# Patient Record
Sex: Male | Born: 1935 | Race: White | Hispanic: No | Smoking: Former smoker
Health system: Southern US, Community
[De-identification: ages and names within clinical notes are randomized; demographics above are authoritative.]

## PROBLEM LIST (undated history)

## (undated) DIAGNOSIS — J45909 Unspecified asthma, uncomplicated: Secondary | ICD-10-CM

## (undated) DIAGNOSIS — Z923 Personal history of irradiation: Secondary | ICD-10-CM

## (undated) DIAGNOSIS — I10 Essential (primary) hypertension: Secondary | ICD-10-CM

## (undated) DIAGNOSIS — IMO0001 Reserved for inherently not codable concepts without codable children: Secondary | ICD-10-CM

## (undated) DIAGNOSIS — R296 Repeated falls: Secondary | ICD-10-CM

## (undated) DIAGNOSIS — M1990 Unspecified osteoarthritis, unspecified site: Secondary | ICD-10-CM

## (undated) DIAGNOSIS — J189 Pneumonia, unspecified organism: Secondary | ICD-10-CM

## (undated) DIAGNOSIS — N210 Calculus in bladder: Secondary | ICD-10-CM

## (undated) DIAGNOSIS — W19XXXA Unspecified fall, initial encounter: Secondary | ICD-10-CM

## (undated) DIAGNOSIS — T8859XA Other complications of anesthesia, initial encounter: Secondary | ICD-10-CM

## (undated) DIAGNOSIS — R011 Cardiac murmur, unspecified: Secondary | ICD-10-CM

## (undated) DIAGNOSIS — R202 Paresthesia of skin: Secondary | ICD-10-CM

## (undated) DIAGNOSIS — Z87442 Personal history of urinary calculi: Secondary | ICD-10-CM

## (undated) DIAGNOSIS — K219 Gastro-esophageal reflux disease without esophagitis: Secondary | ICD-10-CM

## (undated) DIAGNOSIS — C801 Malignant (primary) neoplasm, unspecified: Secondary | ICD-10-CM

## (undated) DIAGNOSIS — I2510 Atherosclerotic heart disease of native coronary artery without angina pectoris: Secondary | ICD-10-CM

## (undated) DIAGNOSIS — Z978 Presence of other specified devices: Secondary | ICD-10-CM

## (undated) DIAGNOSIS — Z8619 Personal history of other infectious and parasitic diseases: Secondary | ICD-10-CM

## (undated) DIAGNOSIS — N400 Enlarged prostate without lower urinary tract symptoms: Secondary | ICD-10-CM

## (undated) DIAGNOSIS — T4145XA Adverse effect of unspecified anesthetic, initial encounter: Secondary | ICD-10-CM

## (undated) DIAGNOSIS — N390 Urinary tract infection, site not specified: Secondary | ICD-10-CM

## (undated) DIAGNOSIS — Z960 Presence of urogenital implants: Secondary | ICD-10-CM

## (undated) HISTORY — PX: EYE SURGERY: SHX253

## (undated) HISTORY — PX: OTHER SURGICAL HISTORY: SHX169

---

## 2015-02-20 DIAGNOSIS — I2510 Atherosclerotic heart disease of native coronary artery without angina pectoris: Secondary | ICD-10-CM | POA: Insufficient documentation

## 2015-02-20 DIAGNOSIS — Z01810 Encounter for preprocedural cardiovascular examination: Secondary | ICD-10-CM | POA: Insufficient documentation

## 2015-02-20 DIAGNOSIS — I350 Nonrheumatic aortic (valve) stenosis: Secondary | ICD-10-CM | POA: Insufficient documentation

## 2015-02-20 DIAGNOSIS — I10 Essential (primary) hypertension: Secondary | ICD-10-CM | POA: Insufficient documentation

## 2015-02-22 ENCOUNTER — Other Ambulatory Visit: Payer: Self-pay | Admitting: Urology

## 2015-02-28 ENCOUNTER — Encounter (HOSPITAL_COMMUNITY): Payer: Self-pay

## 2015-02-28 ENCOUNTER — Encounter (HOSPITAL_COMMUNITY)
Admission: RE | Admit: 2015-02-28 | Discharge: 2015-02-28 | Disposition: A | Discharge status: Home / Self Care | Payer: Medicare HMO | Source: Ambulatory Visit | Attending: Urology | Admitting: Urology

## 2015-02-28 DIAGNOSIS — N401 Enlarged prostate with lower urinary tract symptoms: Secondary | ICD-10-CM | POA: Diagnosis present

## 2015-02-28 DIAGNOSIS — N210 Calculus in bladder: Secondary | ICD-10-CM | POA: Diagnosis not present

## 2015-02-28 DIAGNOSIS — M1990 Unspecified osteoarthritis, unspecified site: Secondary | ICD-10-CM | POA: Diagnosis not present

## 2015-02-28 DIAGNOSIS — Z79899 Other long term (current) drug therapy: Secondary | ICD-10-CM | POA: Diagnosis not present

## 2015-02-28 DIAGNOSIS — I10 Essential (primary) hypertension: Secondary | ICD-10-CM | POA: Diagnosis not present

## 2015-02-28 DIAGNOSIS — Z85820 Personal history of malignant melanoma of skin: Secondary | ICD-10-CM | POA: Diagnosis not present

## 2015-02-28 DIAGNOSIS — R338 Other retention of urine: Secondary | ICD-10-CM | POA: Diagnosis not present

## 2015-02-28 DIAGNOSIS — Z87891 Personal history of nicotine dependence: Secondary | ICD-10-CM | POA: Diagnosis not present

## 2015-02-28 DIAGNOSIS — Z96652 Presence of left artificial knee joint: Secondary | ICD-10-CM | POA: Diagnosis not present

## 2015-02-28 DIAGNOSIS — Z9841 Cataract extraction status, right eye: Secondary | ICD-10-CM | POA: Diagnosis not present

## 2015-02-28 DIAGNOSIS — K219 Gastro-esophageal reflux disease without esophagitis: Secondary | ICD-10-CM | POA: Diagnosis not present

## 2015-02-28 DIAGNOSIS — Z9842 Cataract extraction status, left eye: Secondary | ICD-10-CM | POA: Diagnosis not present

## 2015-02-28 DIAGNOSIS — Z7982 Long term (current) use of aspirin: Secondary | ICD-10-CM | POA: Diagnosis not present

## 2015-02-28 DIAGNOSIS — J45909 Unspecified asthma, uncomplicated: Secondary | ICD-10-CM | POA: Diagnosis not present

## 2015-02-28 HISTORY — DX: Presence of other specified devices: Z97.8

## 2015-02-28 HISTORY — DX: Unspecified fall, initial encounter: W19.XXXA

## 2015-02-28 HISTORY — DX: Other complications of anesthesia, initial encounter: T88.59XA

## 2015-02-28 HISTORY — DX: Unspecified osteoarthritis, unspecified site: M19.90

## 2015-02-28 HISTORY — DX: Calculus in bladder: N21.0

## 2015-02-28 HISTORY — DX: Benign prostatic hyperplasia without lower urinary tract symptoms: N40.0

## 2015-02-28 HISTORY — DX: Adverse effect of unspecified anesthetic, initial encounter: T41.45XA

## 2015-02-28 HISTORY — DX: Repeated falls: R29.6

## 2015-02-28 HISTORY — DX: Pneumonia, unspecified organism: J18.9

## 2015-02-28 HISTORY — DX: Personal history of other infectious and parasitic diseases: Z86.19

## 2015-02-28 HISTORY — DX: Personal history of irradiation: Z92.3

## 2015-02-28 HISTORY — DX: Gastro-esophageal reflux disease without esophagitis: K21.9

## 2015-02-28 HISTORY — DX: Presence of urogenital implants: Z96.0

## 2015-02-28 HISTORY — DX: Malignant (primary) neoplasm, unspecified: C80.1

## 2015-02-28 HISTORY — DX: Unspecified asthma, uncomplicated: J45.909

## 2015-02-28 HISTORY — DX: Essential (primary) hypertension: I10

## 2015-02-28 HISTORY — DX: Reserved for inherently not codable concepts without codable children: IMO0001

## 2015-02-28 HISTORY — DX: Urinary tract infection, site not specified: N39.0

## 2015-02-28 HISTORY — DX: Paresthesia of skin: R20.2

## 2015-02-28 LAB — BASIC METABOLIC PANEL
Anion gap: 6 (ref 5–15)
BUN: 22 mg/dL — AB (ref 6–20)
CHLORIDE: 103 mmol/L (ref 101–111)
CO2: 29 mmol/L (ref 22–32)
Calcium: 9.5 mg/dL (ref 8.9–10.3)
Creatinine, Ser: 1.33 mg/dL — ABNORMAL HIGH (ref 0.61–1.24)
GFR calc Af Amer: 57 mL/min — ABNORMAL LOW (ref >60)
GFR calc non Af Amer: 49 mL/min — ABNORMAL LOW (ref >60)
GLUCOSE: 102 mg/dL — AB (ref 65–99)
POTASSIUM: 5.6 mmol/L — AB (ref 3.5–5.1)
Sodium: 138 mmol/L (ref 135–145)

## 2015-02-28 LAB — CBC
HCT: 41.5 % (ref 39.0–52.0)
HEMOGLOBIN: 12.7 g/dL — AB (ref 13.0–17.0)
MCH: 27.5 pg (ref 26.0–34.0)
MCHC: 30.6 g/dL (ref 30.0–36.0)
MCV: 90 fL (ref 78.0–100.0)
Platelets: 272 10*3/uL (ref 150–400)
RBC: 4.61 MIL/uL (ref 4.22–5.81)
RDW: 15.4 % (ref 11.5–15.5)
WBC: 8.6 10*3/uL (ref 4.0–10.5)

## 2015-02-28 NOTE — Progress Notes (Signed)
EKG per chart 02/21/2015  H&P per chart per Dr Munley/cardiology 02/21/2015 clearance noted

## 2015-02-28 NOTE — Progress Notes (Signed)
BMP results in epic per PAT visit 02/28/2015 sent to Dr Alyson Ingles

## 2015-02-28 NOTE — Progress Notes (Addendum)
Contacted pts nephew Sevyn Paredez in regards to pt is able to use combivent inhaler if needed day of surgery. Nephew verbalized understanding and stated would inform pt. Also informed to bring day of surgery.

## 2015-02-28 NOTE — Patient Instructions (Signed)
Charles George  02/28/2015   Your procedure is scheduled on: Wednesday March 01, 2015  Report to [facility]  Entrance take [place]  elevators to 3rd floor to  [place] at 7:15 AM.  Call this number if you have problems the morning of surgery [phone]   Remember: ONLY 1 PERSON MAY GO WITH YOU TO SHORT STAY TO GET  READY MORNING OF [place].  Do not eat food or drink liquids :After Midnight.     Take these medicines the morning of surgery with A SIP OF WATER: NONE                               You may not have any metal on your body including hair pins and              piercings  Do not wear jewelry,  lotions, powders colognes, deodorant             Men may shave face and neck.   Do not bring valuables to the hospital. [place].  Contacts, dentures or bridgework may not be worn into surgery.      Patients discharged the day of surgery will not be allowed to drive home.  Name and phone number of your driver:Charles George (nephew) [phone]  _____________________________________________________________________             [facility] - Preparing for Surgery Before surgery, you can play an important role.  Because skin is not sterile, your skin needs to be as free of germs as possible.  You can reduce the number of germs on your skin by washing with CHG (chlorahexidine gluconate) soap before surgery.  CHG is an antiseptic cleaner which kills germs and bonds with the skin to continue killing germs even after washing. Please DO NOT use if you have an allergy to CHG or antibacterial soaps.  If your skin becomes reddened/irritated stop using the CHG and inform your nurse when you arrive at Short Stay. Do not shave (including legs and underarms) for at least 48 hours prior to the first CHG shower.  You may shave your face/neck. Please follow these instructions carefully:  1.  Shower with CHG Soap  the night before surgery and the  morning of Surgery.  2.  If you choose to wash your hair, wash your hair first as usual with your  normal  shampoo.  3.  After you shampoo, rinse your hair and body thoroughly to remove the  shampoo.                           4.  Use CHG as you would any other liquid soap.  You can apply chg directly  to the skin and wash                       Gently with a scrungie or clean washcloth.  5.  Apply the CHG Soap to your body ONLY FROM THE NECK DOWN.   Do not use on face/ open  Wound or open sores. Avoid contact with eyes, ears mouth and genitals (private parts).                       Wash face,  Genitals (private parts) with your normal soap.             6.  Wash thoroughly, paying special attention to the area where your surgery  will be performed.  7.  Thoroughly rinse your body with warm water from the neck down.  8.  DO NOT shower/wash with your normal soap after using and rinsing off  the CHG Soap.                9.  Pat yourself dry with a clean towel.            10.  Wear clean pajamas.            11.  Place clean sheets on your bed the night of your first shower and do not  sleep with pets. Day of Surgery : Do not apply any lotions/deodorants the morning of surgery.  Please wear clean clothes to the hospital/surgery center.  FAILURE TO FOLLOW THESE INSTRUCTIONS MAY RESULT IN THE CANCELLATION OF YOUR SURGERY PATIENT SIGNATURE_________________________________  NURSE SIGNATURE__________________________________  ________________________________________________________________________

## 2015-03-01 ENCOUNTER — Encounter (HOSPITAL_COMMUNITY)
Admission: RE | Disposition: A | Discharge status: Home / Self Care | Payer: Self-pay | Source: Ambulatory Visit | Attending: Urology

## 2015-03-01 ENCOUNTER — Ambulatory Visit (HOSPITAL_COMMUNITY)
Admission: RE | Admit: 2015-03-01 | Discharge: 2015-03-02 | Disposition: A | Discharge status: Home / Self Care | Payer: Medicare HMO | Source: Ambulatory Visit | Attending: Urology | Admitting: Urology

## 2015-03-01 ENCOUNTER — Ambulatory Visit (HOSPITAL_COMMUNITY): Payer: Medicare HMO | Admitting: Anesthesiology

## 2015-03-01 ENCOUNTER — Encounter (HOSPITAL_COMMUNITY): Payer: Self-pay | Admitting: *Deleted

## 2015-03-01 DIAGNOSIS — N210 Calculus in bladder: Secondary | ICD-10-CM | POA: Insufficient documentation

## 2015-03-01 DIAGNOSIS — Z7982 Long term (current) use of aspirin: Secondary | ICD-10-CM | POA: Insufficient documentation

## 2015-03-01 DIAGNOSIS — N401 Enlarged prostate with lower urinary tract symptoms: Secondary | ICD-10-CM | POA: Insufficient documentation

## 2015-03-01 DIAGNOSIS — M1990 Unspecified osteoarthritis, unspecified site: Secondary | ICD-10-CM | POA: Insufficient documentation

## 2015-03-01 DIAGNOSIS — Z9841 Cataract extraction status, right eye: Secondary | ICD-10-CM | POA: Insufficient documentation

## 2015-03-01 DIAGNOSIS — I10 Essential (primary) hypertension: Secondary | ICD-10-CM | POA: Diagnosis not present

## 2015-03-01 DIAGNOSIS — Z9842 Cataract extraction status, left eye: Secondary | ICD-10-CM | POA: Insufficient documentation

## 2015-03-01 DIAGNOSIS — R338 Other retention of urine: Secondary | ICD-10-CM | POA: Insufficient documentation

## 2015-03-01 DIAGNOSIS — K219 Gastro-esophageal reflux disease without esophagitis: Secondary | ICD-10-CM | POA: Insufficient documentation

## 2015-03-01 DIAGNOSIS — Z79899 Other long term (current) drug therapy: Secondary | ICD-10-CM | POA: Insufficient documentation

## 2015-03-01 DIAGNOSIS — Z87891 Personal history of nicotine dependence: Secondary | ICD-10-CM | POA: Insufficient documentation

## 2015-03-01 DIAGNOSIS — Z96652 Presence of left artificial knee joint: Secondary | ICD-10-CM | POA: Insufficient documentation

## 2015-03-01 DIAGNOSIS — Z85820 Personal history of malignant melanoma of skin: Secondary | ICD-10-CM | POA: Insufficient documentation

## 2015-03-01 DIAGNOSIS — J45909 Unspecified asthma, uncomplicated: Secondary | ICD-10-CM | POA: Insufficient documentation

## 2015-03-01 DIAGNOSIS — N400 Enlarged prostate without lower urinary tract symptoms: Secondary | ICD-10-CM | POA: Diagnosis present

## 2015-03-01 HISTORY — PX: TRANSURETHRAL RESECTION OF PROSTATE: SHX73

## 2015-03-01 HISTORY — PX: CYSTOSCOPY WITH LITHOLAPAXY: SHX1425

## 2015-03-01 SURGERY — TRANSURETHRAL RESECTION OF THE PROSTATE WITH GYRUS INSTRUMENTS
Anesthesia: Spinal | Site: Urethra

## 2015-03-01 MED ORDER — LACTATED RINGERS IV SOLN
INTRAVENOUS | Status: DC | PRN
  Administered 2015-03-01: 09:00:00 via INTRAVENOUS

## 2015-03-01 MED ORDER — DEXAMETHASONE SODIUM PHOSPHATE 10 MG/ML IJ SOLN
INTRAMUSCULAR | Status: AC
  Filled 2015-03-01: qty 1

## 2015-03-01 MED ORDER — SODIUM CHLORIDE 0.9 % IR SOLN
Status: DC | PRN
  Administered 2015-03-01: 27000 mL

## 2015-03-01 MED ORDER — PIPERACILLIN-TAZOBACTAM 3.375 G IVPB
3.3750 g | Freq: Three times a day (TID) | INTRAVENOUS | Status: AC
  Administered 2015-03-01 (×2): 3.375 g via INTRAVENOUS
  Filled 2015-03-01 (×2): qty 50

## 2015-03-01 MED ORDER — ONDANSETRON HCL 4 MG/2ML IJ SOLN
INTRAMUSCULAR | Status: AC
  Filled 2015-03-01: qty 2

## 2015-03-01 MED ORDER — FENTANYL CITRATE (PF) 100 MCG/2ML IJ SOLN: 25.0000 ug | INTRAMUSCULAR | Status: DC | PRN

## 2015-03-01 MED ORDER — DEXAMETHASONE SODIUM PHOSPHATE 10 MG/ML IJ SOLN
INTRAMUSCULAR | Status: DC | PRN
  Administered 2015-03-01: 10 mg via INTRAVENOUS

## 2015-03-01 MED ORDER — PIPERACILLIN-TAZOBACTAM 3.375 G IVPB 30 MIN
3.3750 g | INTRAVENOUS | Status: AC
  Administered 2015-03-01: 3.375 g via INTRAVENOUS
  Filled 2015-03-01: qty 50

## 2015-03-01 MED ORDER — PROMETHAZINE HCL 25 MG/ML IJ SOLN
6.2500 mg | INTRAMUSCULAR | Status: DC | PRN
Start: 2015-03-01 — End: 2015-03-01

## 2015-03-01 MED ORDER — PIPERACILLIN-TAZOBACTAM 3.375 G IVPB
INTRAVENOUS | Status: AC
  Filled 2015-03-01: qty 50

## 2015-03-01 MED ORDER — BELLADONNA ALKALOIDS-OPIUM 16.2-60 MG RE SUPP: 1.0000 | Freq: Four times a day (QID) | RECTAL | Status: DC | PRN

## 2015-03-01 MED ORDER — SODIUM CHLORIDE 0.9 % IV SOLN
INTRAVENOUS | Status: DC
  Administered 2015-03-01 – 2015-03-02 (×2): via INTRAVENOUS

## 2015-03-01 MED ORDER — ASPIRIN EC 81 MG PO TBEC
81.0000 mg | DELAYED_RELEASE_TABLET | Freq: Every day | ORAL | Status: DC
  Administered 2015-03-01 – 2015-03-02 (×2): 81 mg via ORAL
  Filled 2015-03-01 (×2): qty 1

## 2015-03-01 MED ORDER — DIPHENHYDRAMINE HCL 50 MG/ML IJ SOLN: 12.5000 mg | Freq: Four times a day (QID) | INTRAMUSCULAR | Status: DC | PRN

## 2015-03-01 MED ORDER — DOCUSATE SODIUM 100 MG PO CAPS
100.0000 mg | ORAL_CAPSULE | Freq: Two times a day (BID) | ORAL | Status: DC
  Administered 2015-03-01: 100 mg via ORAL
  Filled 2015-03-01 (×2): qty 1

## 2015-03-01 MED ORDER — 0.9 % SODIUM CHLORIDE (POUR BTL) OPTIME
TOPICAL | Status: DC | PRN
  Administered 2015-03-01: 1000 mL

## 2015-03-01 MED ORDER — HYDROMORPHONE HCL 1 MG/ML IJ SOLN: 0.5000 mg | INTRAMUSCULAR | Status: DC | PRN

## 2015-03-01 MED ORDER — IPRATROPIUM-ALBUTEROL 0.5-2.5 (3) MG/3ML IN SOLN: 3.0000 mL | Freq: Four times a day (QID) | RESPIRATORY_TRACT | Status: DC | PRN

## 2015-03-01 MED ORDER — OXYCODONE HCL 5 MG PO TABS
5.0000 mg | ORAL_TABLET | ORAL | Status: DC | PRN
  Administered 2015-03-01: 5 mg via ORAL
  Filled 2015-03-01: qty 1

## 2015-03-01 MED ORDER — STERILE WATER FOR IRRIGATION IR SOLN
Status: DC | PRN
  Administered 2015-03-01: 10 mL via INTRAVESICAL

## 2015-03-01 MED ORDER — INFLUENZA VAC SPLIT QUAD 0.5 ML IM SUSY
0.5000 mL | PREFILLED_SYRINGE | INTRAMUSCULAR | Status: AC
  Administered 2015-03-02: 0.5 mL via INTRAMUSCULAR
  Filled 2015-03-01 (×2): qty 0.5

## 2015-03-01 MED ORDER — DIPHENHYDRAMINE HCL 12.5 MG/5ML PO ELIX: 12.5000 mg | ORAL_SOLUTION | Freq: Four times a day (QID) | ORAL | Status: DC | PRN

## 2015-03-01 MED ORDER — ONDANSETRON HCL 4 MG/2ML IJ SOLN: 4.0000 mg | INTRAMUSCULAR | Status: DC | PRN

## 2015-03-01 MED ORDER — BUPIVACAINE IN DEXTROSE 0.75-8.25 % IT SOLN
INTRATHECAL | Status: DC | PRN
  Administered 2015-03-01: 1.6 mL via INTRATHECAL

## 2015-03-01 MED ORDER — ONDANSETRON HCL 4 MG/2ML IJ SOLN
INTRAMUSCULAR | Status: DC | PRN
  Administered 2015-03-01: 4 mg via INTRAVENOUS

## 2015-03-01 MED ORDER — IPRATROPIUM-ALBUTEROL 20-100 MCG/ACT IN AERS: 1.0000 | INHALATION_SPRAY | Freq: Four times a day (QID) | RESPIRATORY_TRACT | Status: DC | PRN

## 2015-03-01 MED ORDER — PROPOFOL 10 MG/ML IV BOLUS
INTRAVENOUS | Status: AC
  Filled 2015-03-01: qty 20

## 2015-03-01 MED ORDER — PROPOFOL 10 MG/ML IV BOLUS
INTRAVENOUS | Status: DC | PRN
  Administered 2015-03-01: 30 mg via INTRAVENOUS

## 2015-03-01 MED ORDER — LACTATED RINGERS IV SOLN: INTRAVENOUS | Status: DC

## 2015-03-01 MED ORDER — FENTANYL CITRATE (PF) 100 MCG/2ML IJ SOLN
INTRAMUSCULAR | Status: AC
  Filled 2015-03-01: qty 4

## 2015-03-01 MED ORDER — ZOLPIDEM TARTRATE 5 MG PO TABS
5.0000 mg | ORAL_TABLET | Freq: Every evening | ORAL | Status: DC | PRN
  Administered 2015-03-01: 5 mg via ORAL
  Filled 2015-03-01: qty 1

## 2015-03-01 MED ORDER — DONEPEZIL HCL 5 MG PO TABS
5.0000 mg | ORAL_TABLET | Freq: Every day | ORAL | Status: DC
  Administered 2015-03-01: 5 mg via ORAL
  Filled 2015-03-01: qty 1

## 2015-03-01 SURGICAL SUPPLY — 17 items
BAG URINE DRAINAGE (UROLOGICAL SUPPLIES) ×3 IMPLANT
BAG URO CATCHER STRL LF (DRAPE) ×3 IMPLANT
CARTRIDGE STONEBREAK CO2 KIDNE (ELECTROSURGICAL) ×3 IMPLANT
CATH FOLEY 3WAY 30CC 22FR (CATHETERS) ×3 IMPLANT
GLOVE BIO SURGEON STRL SZ8 (GLOVE) ×3 IMPLANT
GOWN STRL REUS W/TWL LRG LVL3 (GOWN DISPOSABLE) ×6 IMPLANT
HOLDER FOLEY CATH W/STRAP (MISCELLANEOUS) ×3 IMPLANT
IV NS IRRIG 3000ML ARTHROMATIC (IV SOLUTION) ×6 IMPLANT
LOOP CUT BIPOLAR 24F LRG (ELECTROSURGICAL) ×3 IMPLANT
MANIFOLD NEPTUNE II (INSTRUMENTS) ×3 IMPLANT
PACK CYSTO (CUSTOM PROCEDURE TRAY) ×3 IMPLANT
PLUG CATH AND CAP STER (CATHETERS) ×3 IMPLANT
PROBE PNEUMATIC 1.6MM (ELECTROSURGICAL) ×3 IMPLANT
SYR 30ML LL (SYRINGE) ×3 IMPLANT
SYRINGE IRR TOOMEY STRL 70CC (SYRINGE) ×3 IMPLANT
TUBING CONNECTING 10 (TUBING) ×2 IMPLANT
TUBING CONNECTING 10' (TUBING) ×1

## 2015-03-01 NOTE — H&P (Signed)
Urology Admission H&P  Chief Complaint: suprapubic pain  History of Present Illness: Charles George is 81yo with a hx of BPH and retention who presented to my office with an indwelling foley. On office cystoscopy he was found to have a large bladder calculus. He had severe LUTS prior to foley placement. He denies fevers/chill/sweats  Past Medical History  Diagnosis Date  . GERD (gastroesophageal reflux disease)   . Hypertension   . Shortness of breath dyspnea     increased exertion   . Asthma   . Pneumonia     09/2014   . Arthritis   . Tingling     hands bilat   . Falls   . Foley catheter in place   . Bladder stone   . BPH (benign prostatic hypertrophy)   . Urinary tract infection     hx of   . Cancer     melanoma on right arm   . History of radiation therapy   . History of chicken pox   . Complication of anesthesia     10/19/2014 following knee surgery pt states did not awaken for a day and half   Past Surgical History  Procedure Laterality Date  . Left knee replacement       09/2014   . Eye surgery      cataracts removed bilat     Home Medications:  Prescriptions prior to admission  Medication Sig Dispense Refill Last Dose  . acetaminophen (TYLENOL) 325 MG tablet Take 325 mg by mouth at bedtime as needed for moderate pain.     Marland Kitchen aspirin 81 MG tablet Take 81 mg by mouth daily.   02/22/2015  . donepezil (ARICEPT) 5 MG tablet Take 5 mg by mouth at bedtime.   02/28/2015 at pm  . Ipratropium-Albuterol (COMBIVENT RESPIMAT) 20-100 MCG/ACT AERS respimat Inhale 1 puff into the lungs every 6 (six) hours as needed for wheezing or shortness of breath.   03/01/2015 at 0600  . lactobacillus acidophilus (BACID) TABS tablet Take 1 tablet by mouth daily.   02/28/2015 at pm  . tamsulosin (FLOMAX) 0.4 MG CAPS capsule Take 0.4 mg by mouth 2 (two) times daily.   02/28/2015 at pm  . Vitamin D, Ergocalciferol, (DRISDOL) 50000 UNITS CAPS capsule Take 50,000 Units by mouth every 7 (seven) days. Wednesday    02/22/2015   Allergies:  Allergies  Allergen Reactions  . Ciprofloxacin     Stomach pain    History reviewed. No pertinent family history. Social History:  reports that he quit smoking about 30 years ago. His smoking use included Cigarettes. He has a 45 pack-year smoking history. His smokeless tobacco use includes Chew. He reports that he drinks alcohol. He reports that he does not use illicit drugs.  Review of Systems  Gastrointestinal: Positive for nausea and vomiting.  Genitourinary: Positive for dysuria, urgency and frequency.  All other systems reviewed and are negative.   Physical Exam:  Vital signs in last 24 hours: Temp:  [97.6 F (36.4 C)-98 F (36.7 C)] 97.6 F (36.4 C) (09/07 0703) Pulse Rate:  [70-83] 83 (09/07 0703) Resp:  [16-18] 18 (09/07 0703) BP: (136)/(59) 136/59 mmHg (09/06 1016) SpO2:  [99 %-100 %] 99 % (09/07 0703) Weight:  [65.318 kg (144 lb)] 65.318 kg (144 lb) (09/07 0751) Physical Exam  Constitutional: He is oriented to person, place, and time. He appears well-developed and well-nourished.  HENT:  Head: Normocephalic and atraumatic.  Eyes: EOM are normal. Pupils are equal, round, and  reactive to light.  Neck: Normal range of motion. No thyromegaly present.  Cardiovascular: Normal rate and regular rhythm.   Respiratory: Effort normal. No respiratory distress.  GI: Soft. He exhibits no distension.  Musculoskeletal: Normal range of motion.  Neurological: He is alert and oriented to person, place, and time.  Skin: Skin is warm and dry.  Psychiatric: He has a normal mood and affect. His behavior is normal. Judgment and thought content normal.    Laboratory Data:  Results for orders placed or performed during the hospital encounter of 02/28/15 (from the past 24 hour(s))  CBC     Status: Abnormal   Collection Time: 02/28/15 11:15 AM  Result Value Ref Range   WBC 8.6 4.0 - 10.5 K/uL   RBC 4.61 4.22 - 5.81 MIL/uL   Hemoglobin 12.7 (L) 13.0 - 17.0 g/dL    HCT 41.5 39.0 - 52.0 %   MCV 90.0 78.0 - 100.0 fL   MCH 27.5 26.0 - 34.0 pg   MCHC 30.6 30.0 - 36.0 g/dL   RDW 15.4 11.5 - 15.5 %   Platelets 272 150 - 400 K/uL  Basic metabolic panel     Status: Abnormal   Collection Time: 02/28/15 11:15 AM  Result Value Ref Range   Sodium 138 135 - 145 mmol/L   Potassium 5.6 (H) 3.5 - 5.1 mmol/L   Chloride 103 101 - 111 mmol/L   CO2 29 22 - 32 mmol/L   Glucose, Bld 102 (H) 65 - 99 mg/dL   BUN 22 (H) 6 - 20 mg/dL   Creatinine, Ser 1.33 (H) 0.61 - 1.24 mg/dL   Calcium 9.5 8.9 - 10.3 mg/dL   GFR calc non Af Amer 49 (L) >60 mL/min   GFR calc Af Amer 57 (L) >60 mL/min   Anion gap 6 5 - 15   No results found for this or any previous visit (from the past 240 hour(s)). Creatinine:  Recent Labs  02/28/15 1115  CREATININE 1.33*   Baseline Creatinine: unknown  Impression/Assessment:  81yo with urinary retention, BPH, and bladder calculi  Plan:  1. The risks/benefits/alternatives to TURP and cystolithalopaxy was explained to the patient and the patient understands and wishes to proceed with surgery. 2. Pt to be admitted for outpatient with extended recovery following TURP  MCKENZIE, PATRICK L 03/01/2015, 9:15 AM

## 2015-03-01 NOTE — OR Nursing (Signed)
Bladder stone taken by Dr. Alyson Ingles

## 2015-03-01 NOTE — Anesthesia Procedure Notes (Signed)
Spinal Patient location during procedure: OR Staffing Anesthesiologist: Suzette Battiest Performed by: anesthesiologist  Preanesthetic Checklist Completed: patient identified, site marked, surgical consent, pre-op evaluation, timeout performed, IV checked, risks and benefits discussed and monitors and equipment checked Spinal Block Patient position: sitting Prep: ChloraPrep and site prepped and draped Patient monitoring: heart rate, continuous pulse ox and blood pressure Approach: right paramedian Location: L4-5 Injection technique: single-shot Needle Needle type: Spinocan  Needle gauge: 22 G Needle length: 9 cm Additional Notes Expiration date of kit checked and confirmed. Patient tolerated procedure well, without complications. CSF return before and after injection LA.

## 2015-03-01 NOTE — Op Note (Signed)
Preoperative diagnosis: BPH, bladder calculi  Postoperative diagnosis: BPH, bladder calculi  Procedure: 1 cystoscopy 2. Transurethral resection of the prostate 3. Cystolithalopaxy  Attending: Nicolette Bang  Anesthesia: General  Estimated blood loss: Minimal  Drains: 22 French foley  Specimens: 1. Prostate Chips 2. Bladder stone  Antibiotics: Zosyn  Findings: Trilobar prostate enlargement. 3cm bladder stone with multiple smaller stones.  Ureteral orifices in normal anatomic location.   Indications: Patient is a 81 year old male with a history of BPH, retention, and a large bladder stone.  After discussing treatment options, they decided proceed with transurethral resection of the prostate and cystolithalopaxy.  Procedure her in detail: The patient was brought to the operating room and a brief timeout was done to ensure correct patient, correct procedure, correct site.  General anesthesia was administered patient was placed in dorsal lithotomy position.  Their genitalia was then prepped and draped in usual sterile fashion.  A rigid 59 French cystoscope was passed in the urethra and the bladder.  Bladder was inspected and we noted no masses or lesions.  We noted a large jack stone and multiple smaller stones. the ureteral orifices were in the normal orthotopic locations. We placed a nephroscope into the bladder. Using the pneumatic stone breaker we broke the stones into smaller fragments and then irrigated them from the bladder. We then removed the nephroscope and placed a resectoscope into the bladder. We then turned our attention to the prostate resection. Using the bipolar resectoscope we resected the median lobe first from the bladder neck to the verumontanum. We then started at the 12 oclock position on the left lobe and resection to the 6 o'clock position from the bladder neck to the verumontanum. We then did the same resection of the right lobe. Once the resection was complete we then  cauterized individual bleeders. We then removed the prostate chips and sent them for pathology.  We then re-inspected the prostatic fossa and found no residual bleeding.  the bladder was then drained, a 22 French foley was placed and this concluded the procedure which was well tolerated by patient.  Complications: None  Condition: Stable, extubated, transferred to PACU  Plan: Patient is admitted overnight with continuous bladder irrigation. If their urine is clear tomorrow they will be discharged home and followup in 5 days for foley catheter removal and pathology discussion.

## 2015-03-01 NOTE — Brief Op Note (Signed)
03/01/2015  11:21 AM  PATIENT:  Charles George  81 y.o. male  PRE-OPERATIVE DIAGNOSIS:  BENIGN PROSTATIC HYPERTROPHY, BLADDER STONE  POST-OPERATIVE DIAGNOSIS:  BENIGN PROSTATIC HYPERTROPHY, BLADDER   PROCEDURE:  Procedure(s): TRANSURETHRAL RESECTION OF THE PROSTATE  (N/A) CYSTOSCOPY WITH LITHOLAPAXY (N/A)  SURGEON:  Surgeon(s) and Role:    * Cleon Gustin, MD - Primary  PHYSICIAN ASSISTANT:   ASSISTANTS: none   ANESTHESIA:   spinal  EBL:  Total I/O In: 1400 [I.V.:1400] Out: -   BLOOD ADMINISTERED:none  DRAINS: Urinary Catheter (Foley)   LOCAL MEDICATIONS USED:  NONE  SPECIMEN:  Source of Specimen:  Prostate chips, bladder calculi  DISPOSITION OF SPECIMEN:  PATHOLOGY  COUNTS:  YES  TOURNIQUET:  * No tourniquets in log *  DICTATION: .Note written in EPIC  PLAN OF CARE: Admit for overnight observation  PATIENT DISPOSITION:  PACU - hemodynamically stable.   Delay start of Pharmacological VTE agent (>24hrs) due to surgical blood loss or risk of bleeding: not applicable

## 2015-03-01 NOTE — Anesthesia Preprocedure Evaluation (Addendum)
Anesthesia Evaluation  Patient identified by MRN, date of birth, ID band Patient awake    Reviewed: Allergy & Precautions, NPO status , Patient's Chart, lab work & pertinent test results  Airway Mallampati: II  TM Distance: >3 FB Neck ROM: Full    Dental no notable dental hx.    Pulmonary asthma , former smoker,    Pulmonary exam normal breath sounds clear to auscultation       Cardiovascular hypertension, Pt. on medications Normal cardiovascular exam Rhythm:Regular Rate:Normal     Neuro/Psych negative neurological ROS  negative psych ROS   GI/Hepatic Neg liver ROS, GERD  ,  Endo/Other  negative endocrine ROS  Renal/GU negative Renal ROS  negative genitourinary   Musculoskeletal negative musculoskeletal ROS (+)   Abdominal   Peds negative pediatric ROS (+)  Hematology negative hematology ROS (+)   Anesthesia Other Findings   Reproductive/Obstetrics negative OB ROS                           Anesthesia Physical Anesthesia Plan  ASA: II  Anesthesia Plan: Spinal   Post-op Pain Management:    Induction: Intravenous  Airway Management Planned: Natural Airway and Simple Face Mask  Additional Equipment:   Intra-op Plan:   Post-operative Plan:   Informed Consent: I have reviewed the patients History and Physical, chart, labs and discussed the procedure including the risks, benefits and alternatives for the proposed anesthesia with the patient or authorized representative who has indicated his/her understanding and acceptance.   Dental advisory given  Plan Discussed with: CRNA and Surgeon  Anesthesia Plan Comments:       Anesthesia Quick Evaluation

## 2015-03-01 NOTE — Transfer of Care (Signed)
Immediate Anesthesia Transfer of Care Note  Patient: Charles George  Procedure(s) Performed: Procedure(s): TRANSURETHRAL RESECTION OF THE PROSTATE  (N/A) CYSTOSCOPY WITH LITHOLAPAXY (N/A)  Patient Location: PACU  Anesthesia Type:Spinal  Level of Consciousness: awake, alert , oriented and patient cooperative  Airway & Oxygen Therapy: Patient Spontanous Breathing  Post-op Assessment: Report given to RN and Post -op Vital signs reviewed and stable  Post vital signs: Reviewed and stable  Last Vitals:  Filed Vitals:   03/01/15 0703  Pulse: 83  Temp: 36.4 C  Resp: 18    Complications: No apparent anesthesia complications

## 2015-03-02 ENCOUNTER — Encounter (HOSPITAL_COMMUNITY): Payer: Self-pay | Admitting: Urology

## 2015-03-02 DIAGNOSIS — N210 Calculus in bladder: Secondary | ICD-10-CM | POA: Diagnosis not present

## 2015-03-02 MED ORDER — TRAMADOL HCL 50 MG PO TABS
50.0000 mg | ORAL_TABLET | Freq: Four times a day (QID) | ORAL | Status: DC | PRN
Start: 2015-03-02 — End: 2016-08-21

## 2015-03-02 NOTE — Care Management Note (Signed)
Case Management Note  Patient Details  Name: Charles George MRN: [id number] Date of Birth: Dec 11, 1932  Subjective/Objective:   81 y/o m admitted w/BPH.s/p TURP. From home. Active w/HHRN/HHPT from [facility] confirmed. Left message w/[facility] recc HHRn/HHPT order, & face to face. Nsg notified.                 Action/Plan:Await HHC orders.   Expected Discharge Date:               Expected Discharge Plan:  Home/Self Care  In-House Referral:     Discharge planning Services  CM Consult  Post Acute Care Choice:  Home Health (Active w/[facility]-HHRN/HHPT) Choice offered to:     DME Arranged:    DME Agency:     HH Arranged:    HH Agency:     Status of Service:  In process, will continue to follow  Medicare Important Message Given:    Date Medicare IM Given:    Medicare IM give by:    Date Additional Medicare IM Given:    Additional Medicare Important Message give by:     If discussed at [place] of Stay Meetings, dates discussed:    Additional Comments:  Dessa Phi, RN 03/02/2015, 9:46 AM

## 2015-03-02 NOTE — Care Management Note (Signed)
Case Management Note  Patient Details  Name: Charles George MRN: [id number] Date of Birth: Mar 08, 1933  Subjective/Objective:Nsg/CM listened to dr. Noland Fordyce nurse explain that he will not order [facility] for resumption, whomever ordered it initially should be the one to re-order it.  Patient informed & voiced understanding. Patient will talk to his pcp about the [facility]. [facility]-informed them of attending's decision.                    Action/Plan:No d/c needs or orders.   Expected Discharge Date:                 Expected Discharge Plan:  Home/Self Care  In-House Referral:     Discharge planning Services  CM Consult  Post Acute Care Choice:  Home Health (Active w/[facility]-HHRN/HHPT) Choice offered to:     DME Arranged:    DME Agency:     HH Arranged:    HH Agency:     Status of Service:  Completed, signed off  Medicare Important Message Given:    Date Medicare IM Given:    Medicare IM give by:    Date Additional Medicare IM Given:    Additional Medicare Important Message give by:     If discussed at [place] of Stay Meetings, dates discussed:    Additional Comments:  Dessa Phi, RN 03/02/2015, 11:31 AM

## 2015-03-02 NOTE — Discharge Instructions (Signed)
Transurethral Resection of the Prostate Transurethral resection of the prostate (TURP) is the removal of part of your prostate to treat noncancerous (benign) prostatic hyperplasia (BPH). BPH typically occurs in men older than 40 years. It is the abnormal growth of cells in your prostate. Specifically, it is an abnormal increase in the number of cells that make up your prostate tissue. This causes an increase in the size of your prostate. Often, in the case of BPH, the prostate becomes so large that it compresses the tube that drains urine out of your body from your bladder (urethra). Eventually, this compression can obstruct the flow of urine from your bladder. This obstruction can cause recurrent bladder infection and difficulties with bladder control and bladder emptying. The goal of TURP is to remove enough prostate tissue to allow for an unobstructed flow of urine, which often resolves the associated conditions. LET YOUR CAREGIVER KNOW ABOUT:  Any allergies you have.  Any medicines you are taking, including herbs, eye drops, over-the-counter medicines, and creams.  Any problems you have had with the use of anesthetics.  Any blood disorders you have, including bleeding problems and clotting problems.  Previous surgeries you have had.  Any prostate infections you have had. RISKS AND COMPLICATIONS Generally, TURP is a safe procedure. However, as with any surgical procedure, complications can occur. Possible complications associated with TURP include:  Difficulty getting an erection.  Scarring, which may cause problems with the flow of your urine.  Injury to your urethra.  Incontinence from injury to the muscle that surrounds your prostate, which controls urine flow.  Infection.  Bleeding.  Injury to your bladder (rare). BEFORE THE PROCEDURE  Your caregiver will tell you when you need to stop eating and drinking. If you take any medicines, your caregiver will tell you which ones you  may keep taking and which ones you will have to stop taking and when.  Just before the procedure you will also receive medicine to make you fall asleep (general anesthetic). This will be given through a tube that is inserted into one of your veins (intravenous [IV] tube). PROCEDURE Your surgeon inserts an instrument that is similar to a telescope with an electric cutting edge (resectoscope) through your urethra to the area of the prostate gland. The cutting edge is used to remove enlarged pieces of your prostate, one piece at a time. At the end of your procedure, a flexible tube (catheter) will be inserted into your urethra to drain your bladder. Special plastic bags filled with solution will be connected to the end of the catheter. The solution will be used to irrigate blood from your bladder while you heal.  AFTER THE PROCEDURE You will be taken to the recovery area. Once you are awake, stable, and taking fluids well, you will be taken to your hospital room. Typically, you will stay in the hospital 1-2 days after this procedure. The catheter usually is removed before discharge from the hospital. Document Released: 06/10/2005 Document Revised: 03/04/2012 Document Reviewed: 11/11/2011 [facility] Patient Information 2015 [place]. This information is not intended to replace advice given to you by your health care provider. Make sure you discuss any questions you have with your health care provider.

## 2015-03-02 NOTE — Progress Notes (Signed)
Pt and Son given home teaching regarding foley care and leg bag teaching. Demonstration of foley to leg bag done and Pt and Son able to voice understanding of teaching. Pt and Son also verbalized understanding of all other discharge teaching and instructions. VSS and no acute changes noted with Pt's assessment at time of discharge. Pt discharges with clean foley bag and two legs bags.

## 2015-03-03 NOTE — Anesthesia Postprocedure Evaluation (Signed)
  Anesthesia Post-op Note  Patient: Charles George  Procedure(s) Performed: Procedure(s): TRANSURETHRAL RESECTION OF THE PROSTATE  (N/A) CYSTOSCOPY WITH LITHOLAPAXY (N/A)  Patient Location: PACU  Anesthesia Type:Spinal  Level of Consciousness: awake and alert   Airway and Oxygen Therapy: Patient Spontanous Breathing  Post-op Pain: none  Post-op Assessment: Post-op Vital signs reviewed              Post-op Vital Signs: Reviewed  Last Vitals:  Filed Vitals:   03/02/15 0900  BP: 128/55  Pulse: 82  Temp: 36.7 C  Resp: 18    Complications: No apparent anesthesia complications

## 2015-03-06 NOTE — Discharge Summary (Signed)
Physician Discharge Summary  Patient ID: Charles George MRN: [id number] DOB/AGE: [birth year] 81 y.o.  Admit date: 03/01/2015 Discharge date: 03/02/2015  Admission Diagnoses: BPH  Discharge Diagnoses:  Active Problems:   BPH (benign prostatic hyperplasia)   Discharged Condition: good  Hospital Course: The patient tolerated the procedure well and was transferred to the floor on IV pain meds, IV fluid. On POD#1 pt was started on regular diet and they ambulated in the halls. On POD#2 the patient was transitioned to a regular diet, IVFs were discontinued, and the patient passed flatus. Prior to discharge the pt was tolerating a regular diet, pain was controlled on PO pain meds, they were ambulating without difficulty, and they had normal bowel function.   Consults: None  Significant Diagnostic Studies: none  Treatments: IV hydration, antibiotics: ceftriaxone and procedures: TURP  Discharge Exam: Blood pressure 128/55, pulse 82, temperature 98.1 F (36.7 C), temperature source Oral, resp. rate 18, height 6\' 2"  (8.26 m), weight 65.318 kg (144 lb), SpO2 99 %. General appearance: alert, cooperative and appears stated age Head: Normocephalic, without obvious abnormality, atraumatic Ears: normal TM's and external ear canals both ears Neck: no adenopathy, no carotid bruit, no JVD, supple, symmetrical, trachea midline and thyroid not enlarged, symmetric, no tenderness/mass/nodules Resp: clear to auscultation bilaterally Cardio: regular rate and rhythm, S1, S2 normal, no murmur, click, rub or gallop GI: soft, non-tender; bowel sounds normal; no masses,  no organomegaly Extremities: extremities normal, atraumatic, no cyanosis or edema  Disposition: 01-Home or Self Care     Medication List    TAKE these medications        acetaminophen 325 MG tablet  Commonly known as:  TYLENOL  Take 325 mg by mouth at bedtime as needed for moderate pain.     aspirin 81 MG tablet  Take 81 mg by mouth  daily.     COMBIVENT RESPIMAT 20-100 MCG/ACT Aers respimat  Generic drug:  Ipratropium-Albuterol  Inhale 1 puff into the lungs every 6 (six) hours as needed for wheezing or shortness of breath.     donepezil 5 MG tablet  Commonly known as:  ARICEPT  Take 5 mg by mouth at bedtime.     lactobacillus acidophilus Tabs tablet  Take 1 tablet by mouth daily.     tamsulosin 0.4 MG Caps capsule  Commonly known as:  FLOMAX  Take 0.4 mg by mouth 2 (two) times daily.     traMADol 50 MG tablet  Commonly known as:  ULTRAM  Take 1 tablet (50 mg total) by mouth every 6 (six) hours as needed.     Vitamin D (Ergocalciferol) 50000 UNITS Caps capsule  Commonly known as:  DRISDOL  Take 50,000 Units by mouth every 7 (seven) days. Wednesday         Signed: Nicolette Bang L 03/06/2015, 11:10 AM

## 2015-06-29 DIAGNOSIS — N289 Disorder of kidney and ureter, unspecified: Secondary | ICD-10-CM | POA: Diagnosis not present

## 2016-02-29 DIAGNOSIS — I359 Nonrheumatic aortic valve disorder, unspecified: Secondary | ICD-10-CM | POA: Diagnosis not present

## 2016-02-29 DIAGNOSIS — E559 Vitamin D deficiency, unspecified: Secondary | ICD-10-CM | POA: Diagnosis not present

## 2016-02-29 DIAGNOSIS — J449 Chronic obstructive pulmonary disease, unspecified: Secondary | ICD-10-CM | POA: Diagnosis not present

## 2016-02-29 DIAGNOSIS — D649 Anemia, unspecified: Secondary | ICD-10-CM | POA: Diagnosis not present

## 2016-02-29 DIAGNOSIS — Z96659 Presence of unspecified artificial knee joint: Secondary | ICD-10-CM | POA: Diagnosis not present

## 2016-02-29 DIAGNOSIS — M25569 Pain in unspecified knee: Secondary | ICD-10-CM | POA: Diagnosis not present

## 2016-05-03 DIAGNOSIS — M25561 Pain in right knee: Secondary | ICD-10-CM | POA: Diagnosis not present

## 2016-05-03 DIAGNOSIS — M1611 Unilateral primary osteoarthritis, right hip: Secondary | ICD-10-CM | POA: Diagnosis not present

## 2016-05-03 DIAGNOSIS — G8929 Other chronic pain: Secondary | ICD-10-CM | POA: Diagnosis not present

## 2016-05-14 DIAGNOSIS — I359 Nonrheumatic aortic valve disorder, unspecified: Secondary | ICD-10-CM | POA: Diagnosis not present

## 2016-05-14 DIAGNOSIS — Z23 Encounter for immunization: Secondary | ICD-10-CM | POA: Diagnosis not present

## 2016-05-14 DIAGNOSIS — Z681 Body mass index (BMI) 19 or less, adult: Secondary | ICD-10-CM | POA: Diagnosis not present

## 2016-05-14 DIAGNOSIS — M1611 Unilateral primary osteoarthritis, right hip: Secondary | ICD-10-CM | POA: Diagnosis not present

## 2016-05-14 DIAGNOSIS — Z01818 Encounter for other preprocedural examination: Secondary | ICD-10-CM | POA: Diagnosis not present

## 2016-05-14 DIAGNOSIS — Z1389 Encounter for screening for other disorder: Secondary | ICD-10-CM | POA: Diagnosis not present

## 2016-05-14 DIAGNOSIS — Z9181 History of falling: Secondary | ICD-10-CM | POA: Diagnosis not present

## 2016-05-14 DIAGNOSIS — J449 Chronic obstructive pulmonary disease, unspecified: Secondary | ICD-10-CM | POA: Diagnosis not present

## 2016-08-15 ENCOUNTER — Ambulatory Visit: Payer: Self-pay | Admitting: Orthopedic Surgery

## 2016-08-26 NOTE — Patient Instructions (Signed)
Charles George  08/26/2016   Your procedure is scheduled on: 09/04/16  Report to [facility] Main  Entrance take [facility]  elevators to 3rd floor to  [place] at      AM.  Call this number if you have problems the morning of surgery [phone]   Remember: ONLY 1 PERSON MAY GO WITH YOU TO SHORT STAY TO GET  READY MORNING OF [place].  Do not eat food or drink liquids :After Midnight.     Take these medicines the morning of surgery with A SIP OF WATER: NONE                                You may not have any metal on your body including hair pins and              piercings  Do not wear jewelry,lotions, powders or perfumes, deodorant                          Men may shave face and neck.   Do not bring valuables to the hospital. [place].  Contacts, dentures or bridgework may not be worn into surgery.  Leave suitcase in the car. After surgery it may be brought to your room.                Please read over the following fact sheets you were given: _____________________________________________________________________             [facility] - Preparing for Surgery Before surgery, you can play an important role.  Because skin is not sterile, your skin needs to be as free of germs as possible.  You can reduce the number of germs on your skin by washing with CHG (chlorahexidine gluconate) soap before surgery.  CHG is an antiseptic cleaner which kills germs and bonds with the skin to continue killing germs even after washing. Please DO NOT use if you have an allergy to CHG or antibacterial soaps.  If your skin becomes reddened/irritated stop using the CHG and inform your nurse when you arrive at Short Stay. Do not shave (including legs and underarms) for at least 48 hours prior to the first CHG shower.  You may shave your face/neck. Please follow these instructions carefully:  1.  Shower with CHG Soap the  night before surgery and the  morning of Surgery.  2.  If you choose to wash your hair, wash your hair first as usual with your  normal  shampoo.  3.  After you shampoo, rinse your hair and body thoroughly to remove the  shampoo.                           4.  Use CHG as you would any other liquid soap.  You can apply chg directly  to the skin and wash                       Gently with a scrungie or clean washcloth.  5.  Apply the CHG Soap to your body ONLY FROM THE NECK DOWN.   Do not use on face/  open                           Wound or open sores. Avoid contact with eyes, ears mouth and genitals (private parts).                       Wash face,  Genitals (private parts) with your normal soap.             6.  Wash thoroughly, paying special attention to the area where your surgery  will be performed.  7.  Thoroughly rinse your body with warm water from the neck down.  8.  DO NOT shower/wash with your normal soap after using and rinsing off  the CHG Soap.                9.  Pat yourself dry with a clean towel.            10.  Wear clean pajamas.            11.  Place clean sheets on your bed the night of your first shower and do not  sleep with pets. Day of Surgery : Do not apply any lotions/deodorants the morning of surgery.  Please wear clean clothes to the hospital/surgery center.  FAILURE TO FOLLOW THESE INSTRUCTIONS MAY RESULT IN THE CANCELLATION OF YOUR SURGERY PATIENT SIGNATURE_________________________________  NURSE SIGNATURE__________________________________  ________________________________________________________________________  WHAT IS A BLOOD TRANSFUSION? Blood Transfusion Information  A transfusion is the replacement of blood or some of its parts. Blood is made up of multiple cells which provide different functions.  Red blood cells carry oxygen and are used for blood loss replacement.  White blood cells fight against infection.  Platelets control bleeding.  Plasma  helps clot blood.  Other blood products are available for specialized needs, such as hemophilia or other clotting disorders. BEFORE THE TRANSFUSION  Who gives blood for transfusions?   Healthy volunteers who are fully evaluated to make sure their blood is safe. This is blood bank blood. Transfusion therapy is the safest it has ever been in the practice of medicine. Before blood is taken from a donor, a complete history is taken to make sure that person has no history of diseases nor engages in risky social behavior (examples are intravenous drug use or sexual activity with multiple partners). The donor's travel history is screened to minimize risk of transmitting infections, such as malaria. The donated blood is tested for signs of infectious diseases, such as HIV and hepatitis. The blood is then tested to be sure it is compatible with you in order to minimize the chance of a transfusion reaction. If you or a relative donates blood, this is often done in anticipation of surgery and is not appropriate for emergency situations. It takes many days to process the donated blood. RISKS AND COMPLICATIONS Although transfusion therapy is very safe and saves many lives, the main dangers of transfusion include:   Getting an infectious disease.  Developing a transfusion reaction. This is an allergic reaction to something in the blood you were given. Every precaution is taken to prevent this. The decision to have a blood transfusion has been considered carefully by your caregiver before blood is given. Blood is not given unless the benefits outweigh the risks. AFTER THE TRANSFUSION  Right after receiving a blood transfusion, you will usually feel much better and more energetic. This is especially true if your red blood  cells have gotten low (anemic). The transfusion raises the level of the red blood cells which carry oxygen, and this usually causes an energy increase.  The nurse administering the transfusion  will monitor you carefully for complications. HOME CARE INSTRUCTIONS  No special instructions are needed after a transfusion. You may find your energy is better. Speak with your caregiver about any limitations on activity for underlying diseases you may have. SEEK MEDICAL CARE IF:   Your condition is not improving after your transfusion.  You develop redness or irritation at the intravenous (IV) site. SEEK IMMEDIATE MEDICAL CARE IF:  Any of the following symptoms occur over the next 12 hours:  Shaking chills.  You have a temperature by mouth above 102 F (38.9 C), not controlled by medicine.  Chest, back, or muscle pain.  People around you feel you are not acting correctly or are confused.  Shortness of breath or difficulty breathing.  Dizziness and fainting.  You get a rash or develop hives.  You have a decrease in urine output.  Your urine turns a dark color or changes to pink, red, or brown. Any of the following symptoms occur over the next 10 days:  You have a temperature by mouth above 102 F (38.9 C), not controlled by medicine.  Shortness of breath.  Weakness after normal activity.  The white part of the eye turns yellow (jaundice).  You have a decrease in the amount of urine or are urinating less often.  Your urine turns a dark color or changes to pink, red, or brown. Document Released: 06/07/2000 Document Revised: 09/02/2011 Document Reviewed: 01/25/2008 ExitCare Patient Information 2014 [place].  _______________________________________________________________________  Incentive Spirometer  An incentive spirometer is a tool that can help keep your lungs clear and active. This tool measures how well you are filling your lungs with each breath. Taking long deep breaths may help reverse or decrease the chance of developing breathing (pulmonary) problems (especially infection) following:  A long period of time when you are unable to move or be  active. BEFORE THE PROCEDURE   If the spirometer includes an indicator to show your best effort, your nurse or respiratory therapist will set it to a desired goal.  If possible, sit up straight or lean slightly forward. Try not to slouch.  Hold the incentive spirometer in an upright position. INSTRUCTIONS FOR USE  1. Sit on the edge of your bed if possible, or sit up as far as you can in bed or on a chair. 2. Hold the incentive spirometer in an upright position. 3. Breathe out normally. 4. Place the mouthpiece in your mouth and seal your lips tightly around it. 5. Breathe in slowly and as deeply as possible, raising the piston or the ball toward the top of the column. 6. Hold your breath for 3-5 seconds or for as long as possible. Allow the piston or ball to fall to the bottom of the column. 7. Remove the mouthpiece from your mouth and breathe out normally. 8. Rest for a few seconds and repeat Steps 1 through 7 at least 10 times every 1-2 hours when you are awake. Take your time and take a few normal breaths between deep breaths. 9. The spirometer may include an indicator to show your best effort. Use the indicator as a goal to work toward during each repetition. 10. After each set of 10 deep breaths, practice coughing to be sure your lungs are clear. If you have an incision (the cut made  at the time of surgery), support your incision when coughing by placing a pillow or rolled up towels firmly against it. Once you are able to get out of bed, walk around indoors and cough well. You may stop using the incentive spirometer when instructed by your caregiver.  RISKS AND COMPLICATIONS  Take your time so you do not get dizzy or light-headed.  If you are in pain, you may need to take or ask for pain medication before doing incentive spirometry. It is harder to take a deep breath if you are having pain. AFTER USE  Rest and breathe slowly and easily.  It can be helpful to keep track of a log of  your progress. Your caregiver can provide you with a simple table to help with this. If you are using the spirometer at home, follow these instructions: [place] IF:   You are having difficultly using the spirometer.  You have trouble using the spirometer as often as instructed.  Your pain medication is not giving enough relief while using the spirometer.  You develop fever of 100.5 F (38.1 C) or higher. SEEK IMMEDIATE MEDICAL CARE IF:   You cough up bloody sputum that had not been present before.  You develop fever of 102 F (38.9 C) or greater.  You develop worsening pain at or near the incision site. MAKE SURE YOU:   Understand these instructions.  Will watch your condition.  Will get help right away if you are not doing well or get worse. Document Released: 10/21/2006 Document Revised: 09/02/2011 Document Reviewed: 12/22/2006 [facility] Patient Information 2014 [place], [place].   ________________________________________________________________________

## 2016-08-27 ENCOUNTER — Encounter (HOSPITAL_COMMUNITY)
Admission: RE | Admit: 2016-08-27 | Discharge: 2016-08-27 | Disposition: A | Discharge status: Home / Self Care | Payer: PPO | Source: Ambulatory Visit | Attending: Orthopedic Surgery | Admitting: Orthopedic Surgery

## 2016-08-27 ENCOUNTER — Encounter (HOSPITAL_COMMUNITY): Payer: Self-pay

## 2016-08-27 DIAGNOSIS — M1611 Unilateral primary osteoarthritis, right hip: Secondary | ICD-10-CM | POA: Insufficient documentation

## 2016-08-27 DIAGNOSIS — I10 Essential (primary) hypertension: Secondary | ICD-10-CM | POA: Insufficient documentation

## 2016-08-27 DIAGNOSIS — Z01812 Encounter for preprocedural laboratory examination: Secondary | ICD-10-CM | POA: Diagnosis not present

## 2016-08-27 DIAGNOSIS — I517 Cardiomegaly: Secondary | ICD-10-CM | POA: Insufficient documentation

## 2016-08-27 DIAGNOSIS — R001 Bradycardia, unspecified: Secondary | ICD-10-CM | POA: Diagnosis not present

## 2016-08-27 DIAGNOSIS — Z01810 Encounter for preprocedural cardiovascular examination: Secondary | ICD-10-CM | POA: Diagnosis not present

## 2016-08-27 HISTORY — DX: Personal history of urinary calculi: Z87.442

## 2016-08-27 HISTORY — DX: Atherosclerotic heart disease of native coronary artery without angina pectoris: I25.10

## 2016-08-27 HISTORY — DX: Cardiac murmur, unspecified: R01.1

## 2016-08-27 LAB — PROTIME-INR
INR: 1.02
Prothrombin Time: 13.4 seconds (ref 11.4–15.2)

## 2016-08-27 LAB — CBC
HCT: 45 % (ref 39.0–52.0)
Hemoglobin: 14.6 g/dL (ref 13.0–17.0)
MCH: 29.6 pg (ref 26.0–34.0)
MCHC: 32.4 g/dL (ref 30.0–36.0)
MCV: 91.3 fL (ref 78.0–100.0)
PLATELETS: 202 10*3/uL (ref 150–400)
RBC: 4.93 MIL/uL (ref 4.22–5.81)
RDW: 14.5 % (ref 11.5–15.5)
WBC: 9.2 10*3/uL (ref 4.0–10.5)

## 2016-08-27 LAB — COMPREHENSIVE METABOLIC PANEL
ALT: 14 U/L — AB (ref 17–63)
AST: 21 U/L (ref 15–41)
Albumin: 4.1 g/dL (ref 3.5–5.0)
Alkaline Phosphatase: 61 U/L (ref 38–126)
Anion gap: 7 (ref 5–15)
BUN: 25 mg/dL — AB (ref 6–20)
CHLORIDE: 105 mmol/L (ref 101–111)
CO2: 27 mmol/L (ref 22–32)
CREATININE: 1.39 mg/dL — AB (ref 0.61–1.24)
Calcium: 9.5 mg/dL (ref 8.9–10.3)
GFR calc Af Amer: 53 mL/min — ABNORMAL LOW (ref >60)
GFR calc non Af Amer: 46 mL/min — ABNORMAL LOW (ref >60)
Glucose, Bld: 91 mg/dL (ref 65–99)
Potassium: 4 mmol/L (ref 3.5–5.1)
SODIUM: 139 mmol/L (ref 135–145)
Total Bilirubin: 0.7 mg/dL (ref 0.3–1.2)
Total Protein: 7.5 g/dL (ref 6.5–8.1)

## 2016-08-27 LAB — ABO/RH: ABO/RH(D): A NEG

## 2016-08-27 LAB — SURGICAL PCR SCREEN
MRSA, PCR: POSITIVE — AB
STAPHYLOCOCCUS AUREUS: POSITIVE — AB

## 2016-08-27 LAB — APTT: aPTT: 29 seconds (ref 24–36)

## 2016-08-28 NOTE — Progress Notes (Signed)
Final ekg done 08/27/16 in epic

## 2016-08-28 NOTE — Progress Notes (Signed)
PCR done 08/27/16 routed to Dr Maureen Ralphs via epic

## 2016-08-29 DIAGNOSIS — I350 Nonrheumatic aortic (valve) stenosis: Secondary | ICD-10-CM | POA: Diagnosis not present

## 2016-08-29 DIAGNOSIS — I351 Nonrheumatic aortic (valve) insufficiency: Secondary | ICD-10-CM | POA: Diagnosis not present

## 2016-08-29 DIAGNOSIS — I25119 Atherosclerotic heart disease of native coronary artery with unspecified angina pectoris: Secondary | ICD-10-CM | POA: Diagnosis not present

## 2016-08-29 DIAGNOSIS — Z01810 Encounter for preprocedural cardiovascular examination: Secondary | ICD-10-CM | POA: Diagnosis not present

## 2016-08-29 DIAGNOSIS — I10 Essential (primary) hypertension: Secondary | ICD-10-CM | POA: Diagnosis not present

## 2016-09-03 ENCOUNTER — Ambulatory Visit: Payer: Self-pay | Admitting: Orthopedic Surgery

## 2016-09-03 NOTE — H&P (Signed)
Charles George DOB: 08/20/1932 Single / Language: Cleophus Molt / Race: White Male Date of Admission: 09/04/16 CC:  Right hip pain History of Present Illness The patient is a 83 year old male who comes in for a preoperative History and Physical. The patient is scheduled for a right total hip arthroplasty (anterior) to be performed by Dr. Dione Plover. Aluisio, MD at [facility] on 09-04-2016. The patient reports right knee symptoms including: pain, instability, giving way and weakness which began year(s) ago without any known injury. The patient describes their pain as dull, aching and throbbing.The patient feels that the symptoms are worsening. This problem has not been previously evaluated. and include decreased range of motion, difficulty bearing weight and difficulty ambulating. Onset of symptoms was gradual. Current treatment includes restricted activity (use of a walker) and non-opioid analgesics (Tylenol). Note for "Knee pain": Patient has had left TKA about a couple years ago by Dr.Hubler at [employer]. Charles George came in for evaluation of his right knee. He said he has been wheelchair bound or walker bound for couple of years now due to progressively worsening problems with the right leg. Pain is in his thigh and knee. Occasionally it is in his groin also. His mobility has been very limited. He said the leg has felt weak and wants to give out on him. He is having a lot of stiffness also. He had his left knee replaced in [place] three or four years ago and said the knee did well, but he had general anesthesia and had a lot of complications related to that. He had pulmonary complications with pneumonia and said it took him two days to wake up from the surgery. He was seen in evaluation of his right hip and he said he was ready to get the hip replaced. AP pelvis and lateral of his right hip. He has horrific bone-on-bone change in the right hip. His femoral head has actually eroded about a 2 x 2 cm  area of his superior acetabulum. The head is also eroded. He has lost close to 3/4 of an inch of height on that right leg due to this. At this point, the most predictable means of improving pain and function is total hip arthroplasty. The procedure, risks, potential complications and rehab course are discussed in detail and the patient elects to proceed. They have been treated conservatively in the past for the above stated problem and despite conservative measures, they continue to have progressive pain and severe functional limitations and dysfunction. They have failed non-operative management including home exercise, medications. It is felt that they would benefit from undergoing total joint replacement. Risks and benefits of the procedure have been discussed with the patient and they elect to proceed with surgery. There are no active contraindications to surgery such as ongoing infection or rapidly progressive neurological disease.  Problem List/Past Medical Chronic pain of right knee (M25.561)  Primary osteoarthritis of right hip (M16.11)  Asthma  Gastroesophageal Reflux Disease  Kidney Stone  Prostate Disease  BPH Skin Cancer  Sleep Apnea  Osteoarthrosis NOS, lower leg (715.96) [02/05/2006]: Nonrheumatic aortic valve stenosis  Nonrheumatic aoritc valve insufficiency  Coronary Artery Disease/Heart Disease  Hyperlipidemia  Polymyalgia Rheumatica   Allergies Cipro *Fluoroquinolones  Family History Congestive Heart Failure  mother Depression  brother Diabetes Mellitus  brother Heart Disease  mother Hypertension  mother  Social History Alcohol use  current drinker; drinks wine; only occasionally per week Children  1 Current work status  retired Drug/Alcohol  Rehab (Currently)  no Drug/Alcohol Rehab (Previously)  no Exercise  Exercises daily; does running / walking Illicit drug use  no Living situation  live alone Marital status  widowed Number of  flights of stairs before winded  less than 1 Pain Contract  no Tobacco / smoke exposure  no Tobacco use  former smoker; smoke(d) 1 pack(s) per day; uses 1 can(s) smokeless per week Advance Directives  Healthcare POA  Medication History  Combivent Respimat (20-100MCG/ACT Aerosol Soln, Inhalation) Active. Donepezil HCl (5MG  Tablet, Oral) Active. Vitamin D (Ergocalciferol) (50000UNIT Capsule, Oral) Active. Probiotic (Oral) Active. Aspirin (81MG  Tablet Chewable, Oral) Active.  Past Surgical History  Cataract Surgery  bilateral Total Knee Replacement  left   Review of Systems General Not Present- Chills, Fatigue, Fever, Memory Loss, Night Sweats, Weight Gain and Weight Loss. Skin Not Present- Eczema, Hives, Itching, Lesions and Rash. HEENT Not Present- Dentures, Double Vision, Headache, Hearing Loss, Tinnitus and Visual Loss. Respiratory Present- Shortness of breath at rest. Not Present- Allergies, Chronic Cough, Coughing up blood and Shortness of breath with exertion. Cardiovascular Not Present- Chest Pain, Difficulty Breathing Lying Down, Murmur, Palpitations, Racing/skipping heartbeats and Swelling. Gastrointestinal Not Present- Abdominal Pain, Bloody Stool, Constipation, Diarrhea, Difficulty Swallowing, Heartburn, Jaundice, Loss of appetitie, Nausea and Vomiting. Male Genitourinary Present- Urinary frequency and Urinating at Night. Not Present- Blood in Urine, Discharge, Flank Pain, Incontinence, Painful Urination, Urgency, Urinary Retention and Weak urinary stream. Musculoskeletal Present- Joint Pain and Muscle Pain. Not Present- Back Pain, Joint Swelling, Morning Stiffness, Muscle Weakness and Spasms. Neurological Not Present- Blackout spells, Difficulty with balance, Dizziness, Paralysis, Tremor and Weakness. Psychiatric Not Present- Insomnia.  Vitals  Weight: 159 lb Height: 72in Weight was reported by patient. Height was reported by patient. Body Surface Area:  1.93 m Body Mass Index: 21.56 kg/m  Pulse: 80 (Regular)  BP: 138/76 (Sitting, Right Arm, Standard)  Physical Exam  General Mental Status -Alert, cooperative and good historian. General Appearance-pleasant, Not in acute distress. Orientation-Oriented X3. Build & Nutrition-Well nourished and Well developed.  Head and Neck Head-normocephalic, atraumatic . Neck Global Assessment - bruit auscultated on the right, bruit auscultated on the left and supple. Note: likely referred from loud cardiac murmur.  Eye Vision-Wears corrective lenses. Pupil - Bilateral-Regular and Round. Motion - Bilateral-EOMI.  ENMT Note: upper and lower dentures   Chest and Lung Exam Auscultation Breath sounds - clear at anterior chest wall and clear at posterior chest wall. Adventitious sounds - No Adventitious sounds.  Cardiovascular Auscultation Rhythm - Regular rate and rhythm. Heart Sounds - S1 WNL and S2 WNL. Murmurs & Other Heart Sounds: Murmur 1 - Location - Aortic Area and Sternal Border - Left. Timing - Holosystolic. Grade - III/VI.  Abdomen Palpation/Percussion Tenderness - Abdomen is non-tender to palpation. Rigidity (guarding) - Abdomen is soft. Auscultation Auscultation of the abdomen reveals - Bowel sounds normal.  Male Genitourinary Note: Not done, not pertinent to present illness   Musculoskeletal Note: On exam, well-developed male in no distress. His left hip has normal range of motion with no discomfort. The left knee range about 0 to 110 with no tenderness or instability. His right knee shows no effusion. There is no joint line tenderness. Range about 0 to 125. There is no instability. When I flex his thigh he has a bone-on-bone grinding sensation and appears to be coming from his hip. His hip can only be flexed to 100. He has no internal rotation, about 20 external rotation, 20 abduction.  His radiographs of  his knees, AP both knees and lateral show the  prosthesis on the left in good position with no abnormalities. On the right, he has no evidence of any knee arthritis. I subsequently obtained an AP pelvis and lateral of his right hip. He has horrific bone-on-bone change in the right hip. His femoral head has actually eroded about a 2 x 2 cm area of his superior acetabulum. The head is also eroded. He has lost close to 3/4 of an inch of height on that right leg due to this.   Assessment & Plan  Primary osteoarthritis of right hip (M16.11)  Note:Surgical Plans: Right Total Hip Replacement - Anterior Approach  Disposition: Home with HHPT following the hospital stay  PCP: Dr. Wende Neighbors Cards: Dr. Bettina Gavia - pending at time of H&P  Topical TXA  Anesthesia Issues: Difficult to wake up after a previous surgery  Patient was instructed on what medications to stop prior to surgery.  Signed electronically by Ok Edwards, III PA-C

## 2016-09-03 NOTE — Progress Notes (Signed)
Spoke with Caryl Pina at [facility] regarding clearance for Mr. Charles George. She said his echo could not be read until this evening . Surgery is schedule for 09/04/16

## 2016-09-03 NOTE — Progress Notes (Signed)
LVM with velvet McBride that pt. Is pending card clearance from Dr. Bettina Gavia and Echo was done today 3/13 and M.D. Stated it would not be able to be read till this evening . I spoke w/ Caryl Pina at Dr. Oren Binet office in [place]. Surgery is for 0830 pt. To arrive at 0530 3/14.

## 2016-09-03 NOTE — Progress Notes (Signed)
Spoke with Charles George nephew and instructed to arrive to Short Stay at 0915. NPO after midnight except sips of water to take AM meds. Verbalized understanding.

## 2016-09-04 ENCOUNTER — Inpatient Hospital Stay (HOSPITAL_COMMUNITY): Payer: PPO | Admitting: Certified Registered Nurse Anesthetist

## 2016-09-04 ENCOUNTER — Inpatient Hospital Stay (HOSPITAL_COMMUNITY)
Admission: RE | Admit: 2016-09-04 | Discharge: 2016-09-07 | DRG: 470 | Disposition: A | Discharge status: Home Health | Payer: PPO | Source: Ambulatory Visit | Attending: Orthopedic Surgery | Admitting: Orthopedic Surgery

## 2016-09-04 ENCOUNTER — Encounter (HOSPITAL_COMMUNITY)
Admission: RE | Disposition: A | Discharge status: Home Health | Payer: Self-pay | Source: Ambulatory Visit | Attending: Orthopedic Surgery

## 2016-09-04 ENCOUNTER — Encounter (HOSPITAL_COMMUNITY): Payer: Self-pay

## 2016-09-04 ENCOUNTER — Inpatient Hospital Stay (HOSPITAL_COMMUNITY): Payer: PPO

## 2016-09-04 DIAGNOSIS — G8929 Other chronic pain: Secondary | ICD-10-CM | POA: Diagnosis not present

## 2016-09-04 DIAGNOSIS — Z7982 Long term (current) use of aspirin: Secondary | ICD-10-CM

## 2016-09-04 DIAGNOSIS — Z79899 Other long term (current) drug therapy: Secondary | ICD-10-CM | POA: Diagnosis not present

## 2016-09-04 DIAGNOSIS — M169 Osteoarthritis of hip, unspecified: Secondary | ICD-10-CM | POA: Diagnosis present

## 2016-09-04 DIAGNOSIS — Z923 Personal history of irradiation: Secondary | ICD-10-CM

## 2016-09-04 DIAGNOSIS — M353 Polymyalgia rheumatica: Secondary | ICD-10-CM | POA: Diagnosis not present

## 2016-09-04 DIAGNOSIS — M1611 Unilateral primary osteoarthritis, right hip: Principal | ICD-10-CM | POA: Diagnosis present

## 2016-09-04 DIAGNOSIS — I352 Nonrheumatic aortic (valve) stenosis with insufficiency: Secondary | ICD-10-CM | POA: Diagnosis present

## 2016-09-04 DIAGNOSIS — Z96652 Presence of left artificial knee joint: Secondary | ICD-10-CM | POA: Diagnosis not present

## 2016-09-04 DIAGNOSIS — I10 Essential (primary) hypertension: Secondary | ICD-10-CM | POA: Diagnosis not present

## 2016-09-04 DIAGNOSIS — J45909 Unspecified asthma, uncomplicated: Secondary | ICD-10-CM | POA: Diagnosis not present

## 2016-09-04 DIAGNOSIS — Z471 Aftercare following joint replacement surgery: Secondary | ICD-10-CM | POA: Diagnosis not present

## 2016-09-04 DIAGNOSIS — G4730 Sleep apnea, unspecified: Secondary | ICD-10-CM | POA: Diagnosis not present

## 2016-09-04 DIAGNOSIS — N400 Enlarged prostate without lower urinary tract symptoms: Secondary | ICD-10-CM | POA: Diagnosis not present

## 2016-09-04 DIAGNOSIS — Z96649 Presence of unspecified artificial hip joint: Secondary | ICD-10-CM

## 2016-09-04 DIAGNOSIS — I2510 Atherosclerotic heart disease of native coronary artery without angina pectoris: Secondary | ICD-10-CM | POA: Diagnosis not present

## 2016-09-04 DIAGNOSIS — Z881 Allergy status to other antibiotic agents status: Secondary | ICD-10-CM

## 2016-09-04 DIAGNOSIS — Z96641 Presence of right artificial hip joint: Secondary | ICD-10-CM | POA: Diagnosis not present

## 2016-09-04 DIAGNOSIS — Z419 Encounter for procedure for purposes other than remedying health state, unspecified: Secondary | ICD-10-CM

## 2016-09-04 HISTORY — PX: TOTAL HIP ARTHROPLASTY: SHX124

## 2016-09-04 LAB — TYPE AND SCREEN
ABO/RH(D): A NEG
Antibody Screen: NEGATIVE

## 2016-09-04 SURGERY — ARTHROPLASTY, HIP, TOTAL, ANTERIOR APPROACH
Anesthesia: Spinal | Site: Hip | Laterality: Right

## 2016-09-04 MED ORDER — TRAMADOL HCL 50 MG PO TABS
50.0000 mg | ORAL_TABLET | Freq: Four times a day (QID) | ORAL | Status: DC | PRN
  Administered 2016-09-04 (×2): 50 mg via ORAL
  Filled 2016-09-04 (×2): qty 1

## 2016-09-04 MED ORDER — PHENOL 1.4 % MT LIQD: 1.0000 | OROMUCOSAL | Status: DC | PRN

## 2016-09-04 MED ORDER — METOCLOPRAMIDE HCL 5 MG/ML IJ SOLN: 5.0000 mg | Freq: Three times a day (TID) | INTRAMUSCULAR | Status: DC | PRN

## 2016-09-04 MED ORDER — ONDANSETRON HCL 4 MG/2ML IJ SOLN: 4.0000 mg | Freq: Four times a day (QID) | INTRAMUSCULAR | Status: DC | PRN

## 2016-09-04 MED ORDER — ALBUMIN HUMAN 5 % IV SOLN
INTRAVENOUS | Status: AC
  Filled 2016-09-04: qty 500

## 2016-09-04 MED ORDER — ACETAMINOPHEN 325 MG PO TABS
650.0000 mg | ORAL_TABLET | Freq: Four times a day (QID) | ORAL | Status: DC | PRN
  Administered 2016-09-06 – 2016-09-07 (×4): 650 mg via ORAL
  Filled 2016-09-04 (×4): qty 2

## 2016-09-04 MED ORDER — POLYETHYLENE GLYCOL 3350 17 G PO PACK: 17.0000 g | PACK | Freq: Every day | ORAL | Status: DC | PRN

## 2016-09-04 MED ORDER — CEFAZOLIN SODIUM-DEXTROSE 2-4 GM/100ML-% IV SOLN
2.0000 g | Freq: Four times a day (QID) | INTRAVENOUS | Status: AC
  Administered 2016-09-04 (×2): 2 g via INTRAVENOUS
  Filled 2016-09-04 (×2): qty 100

## 2016-09-04 MED ORDER — METOCLOPRAMIDE HCL 5 MG PO TABS: 5.0000 mg | ORAL_TABLET | Freq: Three times a day (TID) | ORAL | Status: DC | PRN

## 2016-09-04 MED ORDER — DIPHENHYDRAMINE HCL 12.5 MG/5ML PO ELIX: 12.5000 mg | ORAL_SOLUTION | ORAL | Status: DC | PRN

## 2016-09-04 MED ORDER — TRANEXAMIC ACID 1000 MG/10ML IV SOLN
2000.0000 mg | Freq: Once | INTRAVENOUS | Status: DC
  Filled 2016-09-04: qty 20

## 2016-09-04 MED ORDER — BUPIVACAINE HCL (PF) 0.25 % IJ SOLN
INTRAMUSCULAR | Status: DC | PRN
Start: 2016-09-04 — End: 2016-09-04
  Administered 2016-09-04: 30 mL

## 2016-09-04 MED ORDER — ONDANSETRON HCL 4 MG/2ML IJ SOLN
INTRAMUSCULAR | Status: AC
  Filled 2016-09-04: qty 2

## 2016-09-04 MED ORDER — PROPOFOL 500 MG/50ML IV EMUL
INTRAVENOUS | Status: DC | PRN
  Administered 2016-09-04: 25 ug/kg/min via INTRAVENOUS

## 2016-09-04 MED ORDER — DOCUSATE SODIUM 100 MG PO CAPS
100.0000 mg | ORAL_CAPSULE | Freq: Two times a day (BID) | ORAL | Status: DC
  Administered 2016-09-04 – 2016-09-07 (×6): 100 mg via ORAL
  Filled 2016-09-04 (×6): qty 1

## 2016-09-04 MED ORDER — BUPIVACAINE HCL (PF) 0.25 % IJ SOLN
INTRAMUSCULAR | Status: AC
  Filled 2016-09-04: qty 30

## 2016-09-04 MED ORDER — IPRATROPIUM-ALBUTEROL 20-100 MCG/ACT IN AERS: 1.0000 | INHALATION_SPRAY | Freq: Four times a day (QID) | RESPIRATORY_TRACT | Status: DC | PRN

## 2016-09-04 MED ORDER — PROPOFOL 10 MG/ML IV BOLUS
INTRAVENOUS | Status: AC
  Filled 2016-09-04: qty 40

## 2016-09-04 MED ORDER — METHOCARBAMOL 1000 MG/10ML IJ SOLN
500.0000 mg | Freq: Four times a day (QID) | INTRAVENOUS | Status: DC | PRN
  Administered 2016-09-04: 500 mg via INTRAVENOUS
  Filled 2016-09-04: qty 550
  Filled 2016-09-04: qty 5

## 2016-09-04 MED ORDER — ONDANSETRON HCL 4 MG/2ML IJ SOLN
INTRAMUSCULAR | Status: DC | PRN
  Administered 2016-09-04: 4 mg via INTRAVENOUS

## 2016-09-04 MED ORDER — BUPIVACAINE IN DEXTROSE 0.75-8.25 % IT SOLN
INTRATHECAL | Status: DC | PRN
  Administered 2016-09-04: 2 mL via INTRATHECAL

## 2016-09-04 MED ORDER — CHLORHEXIDINE GLUCONATE 4 % EX LIQD: 60.0000 mL | Freq: Once | CUTANEOUS | Status: DC

## 2016-09-04 MED ORDER — RIVAROXABAN 10 MG PO TABS
10.0000 mg | ORAL_TABLET | Freq: Every day | ORAL | Status: DC
  Administered 2016-09-05 – 2016-09-07 (×3): 10 mg via ORAL
  Filled 2016-09-04 (×3): qty 1

## 2016-09-04 MED ORDER — SODIUM CHLORIDE 0.9 % IV SOLN
INTRAVENOUS | Status: DC
  Administered 2016-09-04: 22:00:00 via INTRAVENOUS

## 2016-09-04 MED ORDER — ACETAMINOPHEN 10 MG/ML IV SOLN
INTRAVENOUS | Status: AC
  Filled 2016-09-04: qty 100

## 2016-09-04 MED ORDER — ACETAMINOPHEN 650 MG RE SUPP: 650.0000 mg | Freq: Four times a day (QID) | RECTAL | Status: DC | PRN

## 2016-09-04 MED ORDER — DEXAMETHASONE SODIUM PHOSPHATE 10 MG/ML IJ SOLN
10.0000 mg | Freq: Once | INTRAMUSCULAR | Status: AC
  Administered 2016-09-04: 10 mg via INTRAVENOUS

## 2016-09-04 MED ORDER — TRANEXAMIC ACID 1000 MG/10ML IV SOLN
INTRAVENOUS | Status: DC | PRN
  Administered 2016-09-04: 2000 mg via TOPICAL

## 2016-09-04 MED ORDER — PROPOFOL 10 MG/ML IV BOLUS
INTRAVENOUS | Status: AC
  Filled 2016-09-04: qty 20

## 2016-09-04 MED ORDER — ACETAMINOPHEN 500 MG PO TABS
1000.0000 mg | ORAL_TABLET | Freq: Four times a day (QID) | ORAL | Status: AC
  Administered 2016-09-04 – 2016-09-05 (×4): 1000 mg via ORAL
  Filled 2016-09-04 (×5): qty 2

## 2016-09-04 MED ORDER — MORPHINE SULFATE (PF) 4 MG/ML IV SOLN: 1.0000 mg | INTRAVENOUS | Status: DC | PRN

## 2016-09-04 MED ORDER — VANCOMYCIN HCL IN DEXTROSE 1-5 GM/200ML-% IV SOLN
1000.0000 mg | Freq: Once | INTRAVENOUS | Status: AC
  Administered 2016-09-04: 1000 mg via INTRAVENOUS
  Filled 2016-09-04: qty 200

## 2016-09-04 MED ORDER — METHOCARBAMOL 500 MG PO TABS
500.0000 mg | ORAL_TABLET | Freq: Four times a day (QID) | ORAL | Status: DC | PRN
Start: 2016-09-04 — End: 2016-09-05

## 2016-09-04 MED ORDER — MEPERIDINE HCL 50 MG/ML IJ SOLN: 6.2500 mg | INTRAMUSCULAR | Status: DC | PRN

## 2016-09-04 MED ORDER — CEFAZOLIN SODIUM-DEXTROSE 2-4 GM/100ML-% IV SOLN
2.0000 g | INTRAVENOUS | Status: AC
  Administered 2016-09-04: 2 g via INTRAVENOUS
  Filled 2016-09-04: qty 100

## 2016-09-04 MED ORDER — ACETAMINOPHEN 10 MG/ML IV SOLN
1000.0000 mg | Freq: Once | INTRAVENOUS | Status: AC
  Administered 2016-09-04: 1000 mg via INTRAVENOUS

## 2016-09-04 MED ORDER — BISACODYL 10 MG RE SUPP: 10.0000 mg | Freq: Every day | RECTAL | Status: DC | PRN

## 2016-09-04 MED ORDER — DEXAMETHASONE SODIUM PHOSPHATE 10 MG/ML IJ SOLN
10.0000 mg | Freq: Once | INTRAMUSCULAR | Status: AC
  Administered 2016-09-05: 11:00:00 10 mg via INTRAVENOUS
  Filled 2016-09-04: qty 1

## 2016-09-04 MED ORDER — OXYCODONE HCL 5 MG PO TABS: 5.0000 mg | ORAL_TABLET | ORAL | Status: DC | PRN

## 2016-09-04 MED ORDER — PHENYLEPHRINE HCL 10 MG/ML IJ SOLN
INTRAMUSCULAR | Status: AC
  Filled 2016-09-04: qty 1

## 2016-09-04 MED ORDER — 0.9 % SODIUM CHLORIDE (POUR BTL) OPTIME
TOPICAL | Status: DC | PRN
  Administered 2016-09-04: 1000 mL

## 2016-09-04 MED ORDER — DEXAMETHASONE SODIUM PHOSPHATE 10 MG/ML IJ SOLN
INTRAMUSCULAR | Status: AC
  Filled 2016-09-04: qty 1

## 2016-09-04 MED ORDER — FLEET ENEMA 7-19 GM/118ML RE ENEM: 1.0000 | ENEMA | Freq: Once | RECTAL | Status: DC | PRN

## 2016-09-04 MED ORDER — PHENYLEPHRINE HCL 10 MG/ML IJ SOLN
INTRAVENOUS | Status: DC | PRN
  Administered 2016-09-04: 50 ug/min via INTRAVENOUS

## 2016-09-04 MED ORDER — CHLORHEXIDINE GLUCONATE CLOTH 2 % EX PADS: 6.0000 | MEDICATED_PAD | Freq: Every day | CUTANEOUS | Status: DC

## 2016-09-04 MED ORDER — ONDANSETRON HCL 4 MG PO TABS: 4.0000 mg | ORAL_TABLET | Freq: Four times a day (QID) | ORAL | Status: DC | PRN

## 2016-09-04 MED ORDER — FENTANYL CITRATE (PF) 100 MCG/2ML IJ SOLN
INTRAMUSCULAR | Status: AC
  Filled 2016-09-04: qty 2

## 2016-09-04 MED ORDER — MENTHOL 3 MG MT LOZG: 1.0000 | LOZENGE | OROMUCOSAL | Status: DC | PRN

## 2016-09-04 MED ORDER — FENTANYL CITRATE (PF) 100 MCG/2ML IJ SOLN
25.0000 ug | INTRAMUSCULAR | Status: DC | PRN
  Administered 2016-09-04: 50 ug via INTRAVENOUS

## 2016-09-04 MED ORDER — ALBUMIN HUMAN 5 % IV SOLN
INTRAVENOUS | Status: DC | PRN
  Administered 2016-09-04 (×2): via INTRAVENOUS

## 2016-09-04 MED ORDER — LACTATED RINGERS IV SOLN
INTRAVENOUS | Status: DC
  Administered 2016-09-04 (×2): via INTRAVENOUS

## 2016-09-04 MED ORDER — DONEPEZIL HCL 10 MG PO TABS
5.0000 mg | ORAL_TABLET | Freq: Every day | ORAL | Status: DC
  Administered 2016-09-04 – 2016-09-06 (×3): 5 mg via ORAL
  Filled 2016-09-04 (×3): qty 1

## 2016-09-04 MED ORDER — LACTATED RINGERS IV SOLN
INTRAVENOUS | Status: DC
  Administered 2016-09-04: 1000 mL via INTRAVENOUS

## 2016-09-04 MED ORDER — PROMETHAZINE HCL 25 MG/ML IJ SOLN: 6.2500 mg | INTRAMUSCULAR | Status: DC | PRN

## 2016-09-04 MED ORDER — MUPIROCIN 2 % EX OINT
TOPICAL_OINTMENT | Freq: Two times a day (BID) | CUTANEOUS | Status: DC
  Administered 2016-09-06: 10:00:00 via NASAL
  Filled 2016-09-04 (×2): qty 22

## 2016-09-04 MED ORDER — IPRATROPIUM-ALBUTEROL 0.5-2.5 (3) MG/3ML IN SOLN: 3.0000 mL | Freq: Four times a day (QID) | RESPIRATORY_TRACT | Status: DC | PRN

## 2016-09-04 SURGICAL SUPPLY — 40 items
BAG DECANTER FOR FLEXI CONT (MISCELLANEOUS) ×2 IMPLANT
BAG ZIPLOCK 12X15 (MISCELLANEOUS) ×2 IMPLANT
BLADE SAG 18X100X1.27 (BLADE) ×2 IMPLANT
BONE CHIP PRESERV 30CC PCAN30 (Bone Implant) ×2 IMPLANT
CAPT HIP TOTAL 2 ×2 IMPLANT
CLOTH BEACON ORANGE TIMEOUT ST (SAFETY) ×2 IMPLANT
COVER PERINEAL POST (MISCELLANEOUS) ×2 IMPLANT
DECANTER SPIKE VIAL GLASS SM (MISCELLANEOUS) IMPLANT
DRAPE STERI IOBAN 125X83 (DRAPES) ×2 IMPLANT
DRAPE U-SHAPE 47X51 STRL (DRAPES) ×4 IMPLANT
DRSG ADAPTIC 3X8 NADH LF (GAUZE/BANDAGES/DRESSINGS) ×2 IMPLANT
DRSG MEPILEX BORDER 4X4 (GAUZE/BANDAGES/DRESSINGS) ×2 IMPLANT
DRSG MEPILEX BORDER 4X8 (GAUZE/BANDAGES/DRESSINGS) ×2 IMPLANT
DURAPREP 26ML APPLICATOR (WOUND CARE) ×2 IMPLANT
ELECT REM PT RETURN 9FT ADLT (ELECTROSURGICAL) ×2
ELECTRODE REM PT RTRN 9FT ADLT (ELECTROSURGICAL) ×1 IMPLANT
EVACUATOR 1/8 PVC DRAIN (DRAIN) ×2 IMPLANT
GLOVE BIO SURGEON STRL SZ7.5 (GLOVE) ×2 IMPLANT
GLOVE BIO SURGEON STRL SZ8 (GLOVE) ×4 IMPLANT
GLOVE BIOGEL PI IND STRL 8 (GLOVE) ×2 IMPLANT
GLOVE BIOGEL PI INDICATOR 8 (GLOVE) ×2
GOWN STRL REUS W/TWL LRG LVL3 (GOWN DISPOSABLE) ×2 IMPLANT
GOWN STRL REUS W/TWL XL LVL3 (GOWN DISPOSABLE) ×2 IMPLANT
NDL SAFETY ECLIPSE 18X1.5 (NEEDLE) ×1 IMPLANT
NEEDLE HYPO 18GX1.5 SHARP (NEEDLE) ×1
PACK ANTERIOR HIP CUSTOM (KITS) ×2 IMPLANT
STRIP CLOSURE SKIN 1/2X4 (GAUZE/BANDAGES/DRESSINGS) ×4 IMPLANT
SUT ETHIBOND NAB CT1 #1 30IN (SUTURE) ×2 IMPLANT
SUT MNCRL AB 4-0 PS2 18 (SUTURE) ×2 IMPLANT
SUT STRATAFIX 0 PDS 27 VIOLET (SUTURE) ×2
SUT VIC AB 2-0 CT1 27 (SUTURE) ×2
SUT VIC AB 2-0 CT1 TAPERPNT 27 (SUTURE) ×2 IMPLANT
SUT VLOC 180 0 24IN GS25 (SUTURE) IMPLANT
SUTURE STRATFX 0 PDS 27 VIOLET (SUTURE) ×1 IMPLANT
SWAB COLLECTION DEVICE MRSA (MISCELLANEOUS) ×2 IMPLANT
SWAB CULTURE ESWAB REG 1ML (MISCELLANEOUS) ×2 IMPLANT
SYR 50ML LL SCALE MARK (SYRINGE) ×2 IMPLANT
TRAY FOLEY W/METER SILVER 16FR (SET/KITS/TRAYS/PACK) ×2 IMPLANT
WATER STERILE IRR 1000ML POUR (IV SOLUTION) ×4 IMPLANT
YANKAUER SUCT BULB TIP 10FT TU (MISCELLANEOUS) ×4 IMPLANT

## 2016-09-04 NOTE — Progress Notes (Signed)
Spoke w Dawn at Dr [facility] office, cardiology that we still have not received ECHO results and cardiac clearance

## 2016-09-04 NOTE — Transfer of Care (Signed)
Immediate Anesthesia Transfer of Care Note  Patient: Charles George  Procedure(s) Performed: Procedure(s): RIGHT TOTAL HIP ARTHROPLASTY ANTERIOR APPROACH  WITH ACETABULUM GRAFTING (Right)  Patient Location: PACU  Anesthesia Type:MAC and Spinal  Level of Consciousness:  sedated, patient cooperative and responds to stimulation  Airway & Oxygen Therapy:Patient Spontanous Breathing and Patient connected to face mask oxgen  Post-op Assessment:  Report given to PACU RN and Post -op Vital signs reviewed and stable  Post vital signs:  Reviewed and stable  Last Vitals:  Vitals:   09/04/16 0902  BP: (!) 161/64  Pulse: 84  Resp: 16  Temp: 94.8 C    Complications: No apparent anesthesia complications

## 2016-09-04 NOTE — Progress Notes (Signed)
Portable AP Pelvis X-ray done. 

## 2016-09-04 NOTE — H&P (View-Only) (Signed)
Regis Bill DOB: [birth year] Single / Language: Cleophus Molt / Race: White Male Date of Admission: 09/04/16 CC:  Right hip pain History of Present Illness The patient is a 83 year old male who comes in for a preoperative History and Physical. The patient is scheduled for a right total hip arthroplasty (anterior) to be performed by Dr. Dione Plover. Aluisio, MD at [facility] on 09-04-2016. The patient reports right knee symptoms including: pain, instability, giving way and weakness which began year(s) ago without any known injury. The patient describes their pain as dull, aching and throbbing.The patient feels that the symptoms are worsening. This problem has not been previously evaluated. and include decreased range of motion, difficulty bearing weight and difficulty ambulating. Onset of symptoms was gradual. Current treatment includes restricted activity (use of a walker) and non-opioid analgesics (Tylenol). Note for "Knee pain": Patient has had left TKA about a couple years ago by Dr.Hubler at [employer]. Mr. Alsop came in for evaluation of his right knee. He said he has been wheelchair bound or walker bound for couple of years now due to progressively worsening problems with the right leg. Pain is in his thigh and knee. Occasionally it is in his groin also. His mobility has been very limited. He said the leg has felt weak and wants to give out on him. He is having a lot of stiffness also. He had his left knee replaced in [place] three or four years ago and said the knee did well, but he had general anesthesia and had a lot of complications related to that. He had pulmonary complications with pneumonia and said it took him two days to wake up from the surgery. He was seen in evaluation of his right hip and he said he was ready to get the hip replaced. AP pelvis and lateral of his right hip. He has horrific bone-on-bone change in the right hip. His femoral head has actually eroded about a 2 x 2 cm  area of his superior acetabulum. The head is also eroded. He has lost close to 3/4 of an inch of height on that right leg due to this. At this point, the most predictable means of improving pain and function is total hip arthroplasty. The procedure, risks, potential complications and rehab course are discussed in detail and the patient elects to proceed. They have been treated conservatively in the past for the above stated problem and despite conservative measures, they continue to have progressive pain and severe functional limitations and dysfunction. They have failed non-operative management including home exercise, medications. It is felt that they would benefit from undergoing total joint replacement. Risks and benefits of the procedure have been discussed with the patient and they elect to proceed with surgery. There are no active contraindications to surgery such as ongoing infection or rapidly progressive neurological disease.  Problem List/Past Medical Chronic pain of right knee (M25.561)  Primary osteoarthritis of right hip (M16.11)  Asthma  Gastroesophageal Reflux Disease  Kidney Stone  Prostate Disease  BPH Skin Cancer  Sleep Apnea  Osteoarthrosis NOS, lower leg (715.96) [02/05/2006]: Nonrheumatic aortic valve stenosis  Nonrheumatic aoritc valve insufficiency  Coronary Artery Disease/Heart Disease  Hyperlipidemia  Polymyalgia Rheumatica   Allergies Cipro *Fluoroquinolones  Family History Congestive Heart Failure  mother Depression  brother Diabetes Mellitus  brother Heart Disease  mother Hypertension  mother  Social History Alcohol use  current drinker; drinks wine; only occasionally per week Children  1 Current work status  retired Drug/Alcohol  Rehab (Currently)  no Drug/Alcohol Rehab (Previously)  no Exercise  Exercises daily; does running / walking Illicit drug use  no Living situation  live alone Marital status  widowed Number of  flights of stairs before winded  less than 1 Pain Contract  no Tobacco / smoke exposure  no Tobacco use  former smoker; smoke(d) 1 pack(s) per day; uses 1 can(s) smokeless per week Advance Directives  Healthcare POA  Medication History  Combivent Respimat (20-100MCG/ACT Aerosol Soln, Inhalation) Active. Donepezil HCl (5MG  Tablet, Oral) Active. Vitamin D (Ergocalciferol) (50000UNIT Capsule, Oral) Active. Probiotic (Oral) Active. Aspirin (81MG  Tablet Chewable, Oral) Active.  Past Surgical History  Cataract Surgery  bilateral Total Knee Replacement  left   Review of Systems General Not Present- Chills, Fatigue, Fever, Memory Loss, Night Sweats, Weight Gain and Weight Loss. Skin Not Present- Eczema, Hives, Itching, Lesions and Rash. HEENT Not Present- Dentures, Double Vision, Headache, Hearing Loss, Tinnitus and Visual Loss. Respiratory Present- Shortness of breath at rest. Not Present- Allergies, Chronic Cough, Coughing up blood and Shortness of breath with exertion. Cardiovascular Not Present- Chest Pain, Difficulty Breathing Lying Down, Murmur, Palpitations, Racing/skipping heartbeats and Swelling. Gastrointestinal Not Present- Abdominal Pain, Bloody Stool, Constipation, Diarrhea, Difficulty Swallowing, Heartburn, Jaundice, Loss of appetitie, Nausea and Vomiting. Male Genitourinary Present- Urinary frequency and Urinating at Night. Not Present- Blood in Urine, Discharge, Flank Pain, Incontinence, Painful Urination, Urgency, Urinary Retention and Weak urinary stream. Musculoskeletal Present- Joint Pain and Muscle Pain. Not Present- Back Pain, Joint Swelling, Morning Stiffness, Muscle Weakness and Spasms. Neurological Not Present- Blackout spells, Difficulty with balance, Dizziness, Paralysis, Tremor and Weakness. Psychiatric Not Present- Insomnia.  Vitals  Weight: 159 lb Height: 72in Weight was reported by patient. Height was reported by patient. Body Surface Area:  1.93 m Body Mass Index: 21.56 kg/m  Pulse: 80 (Regular)  BP: 138/76 (Sitting, Right Arm, Standard)  Physical Exam  General Mental Status -Alert, cooperative and good historian. General Appearance-pleasant, Not in acute distress. Orientation-Oriented X3. Build & Nutrition-Well nourished and Well developed.  Head and Neck Head-normocephalic, atraumatic . Neck Global Assessment - bruit auscultated on the right, bruit auscultated on the left and supple. Note: likely referred from loud cardiac murmur.  Eye Vision-Wears corrective lenses. Pupil - Bilateral-Regular and Round. Motion - Bilateral-EOMI.  ENMT Note: upper and lower dentures   Chest and Lung Exam Auscultation Breath sounds - clear at anterior chest wall and clear at posterior chest wall. Adventitious sounds - No Adventitious sounds.  Cardiovascular Auscultation Rhythm - Regular rate and rhythm. Heart Sounds - S1 WNL and S2 WNL. Murmurs & Other Heart Sounds: Murmur 1 - Location - Aortic Area and Sternal Border - Left. Timing - Holosystolic. Grade - III/VI.  Abdomen Palpation/Percussion Tenderness - Abdomen is non-tender to palpation. Rigidity (guarding) - Abdomen is soft. Auscultation Auscultation of the abdomen reveals - Bowel sounds normal.  Male Genitourinary Note: Not done, not pertinent to present illness   Musculoskeletal Note: On exam, well-developed male in no distress. His left hip has normal range of motion with no discomfort. The left knee range about 0 to 110 with no tenderness or instability. His right knee shows no effusion. There is no joint line tenderness. Range about 0 to 125. There is no instability. When I flex his thigh he has a bone-on-bone grinding sensation and appears to be coming from his hip. His hip can only be flexed to 100. He has no internal rotation, about 20 external rotation, 20 abduction.  His radiographs of  his knees, AP both knees and lateral show the  prosthesis on the left in good position with no abnormalities. On the right, he has no evidence of any knee arthritis. I subsequently obtained an AP pelvis and lateral of his right hip. He has horrific bone-on-bone change in the right hip. His femoral head has actually eroded about a 2 x 2 cm area of his superior acetabulum. The head is also eroded. He has lost close to 3/4 of an inch of height on that right leg due to this.   Assessment & Plan  Primary osteoarthritis of right hip (M16.11)  Note:Surgical Plans: Right Total Hip Replacement - Anterior Approach  Disposition: Home with HHPT following the hospital stay  PCP: Dr. Wende Neighbors Cards: Dr. Bettina Gavia - pending at time of H&P  Topical TXA  Anesthesia Issues: Difficult to wake up after a previous surgery  Patient was instructed on what medications to stop prior to surgery.  Signed electronically by Ok Edwards, III PA-C

## 2016-09-04 NOTE — Op Note (Signed)
OPERATIVE REPORT- TOTAL HIP ARTHROPLASTY   PREOPERATIVE DIAGNOSIS: Osteoarthritis of the Right hip.   POSTOPERATIVE DIAGNOSIS: Osteoarthritis of the Right  hip.   PROCEDURE: Right total hip arthroplasty, anterior approach.   SURGEON: Gaynelle Arabian, MD   ASSISTANT: Arlee Muslim, PA-C  ANESTHESIA:  Spinal  ESTIMATED BLOOD LOSS:-550 ml    DRAINS: Hemovac x1.   COMPLICATIONS: None   CONDITION: PACU - hemodynamically stable.   BRIEF CLINICAL NOTE: Charles George is a 83 y.o. male who has advanced end-  stage arthritis of their Right  hip with progressively worsening pain and  dysfunction.The patient has failed nonoperative management and presents for  total hip arthroplasty. He has a rapidly progressive osteoarthritis with near complete erosion of his femoral head.  PROCEDURE IN DETAIL: After successful administration of spinal  anesthetic, the traction boots for the [facility] bed were placed on both  feet and the patient was placed onto the [facility] bed, boots placed into the leg  holders. The Right hip was then isolated from the perineum with plastic  drapes and prepped and draped in the usual sterile fashion. ASIS and  greater trochanter were marked and a oblique incision was made, starting  at about 1 cm lateral and 2 cm distal to the ASIS and coursing towards  the anterior cortex of the femur. The skin was cut with a 10 blade  through subcutaneous tissue to the level of the fascia overlying the  tensor fascia lata muscle. The fascia was then incised in line with the  incision at the junction of the anterior third and posterior 2/3rd. The  muscle was teased off the fascia and then the interval between the TFL  and the rectus was developed. The Hohmann retractor was then placed at  the top of the femoral neck over the capsule. The vessels overlying the  capsule were cauterized and the fat on top of the capsule was removed.  A Hohmann retractor was then placed anterior  underneath the rectus  femoris to give exposure to the entire anterior capsule. A T-shaped  capsulotomy was performed. There was a large amount of caseous fluid present  In the joint consistent with necrosis of the bone versus infection. Two samples are sent for stat gram stain And the results were rare WBCs and no organisms.The edges were tagged and the remains of the femoral head  are identified.       Osteophytes are removed off the superior acetabulum.  The femoral neck was then cut in situ with an oscillating saw. Traction  was then applied to the left lower extremity utilizing the [facility]  traction. The femoral head was then removed piecemeal as it was necrotic and falling apart. Retractors were placed  around the acetabulum and then circumferential removal of the labrum was  performed. Osteophytes were also removed. Reaming starts at 47 mm to  medialize and  Increased in 2 mm increments to 57 mm. We reamed in  approximately 40 degrees of abduction, 20 degrees anteversion. There was a large cavitary defect in the acetabulum Which was filled with viable autograft from the femoral head mixed with cancellous allograft. This effectively filled the defect. A 58 mm  pinnacle acetabular shell was then impacted in anatomic position under  fluoroscopic guidance with excellent purchase. I placed 2 additional dome screws. A 36 mm neutral + 4 marathon liner was then  placed into the acetabular shell.       The femoral lift was then  placed along the lateral aspect of the femur  just distal to the vastus ridge. The leg was  externally rotated and capsule  was stripped off the inferior aspect of the femoral neck down to the  level of the lesser trochanter, this was done with electrocautery. The femur was lifted after this was performed. The  leg was then placed in an extended and adducted position essentially delivering the femur. We also removed the capsule superiorly and the piriformis from the  piriformis fossa to gain excellent exposure of the  proximal femur. Rongeur was used to remove some cancellous bone to get  into the lateral portion of the proximal femur for placement of the  initial starter reamer. The starter broaches was placed  the starter broach  and was shown to go down the center of the canal. Broaching  with the  Corail system was then performed starting at size 8, coursing  Up to size 12. A size 12 had excellent torsional and rotational  and axial stability. The trial high offset neck was then placed  with a 36 + 5 trial head. The hip was then reduced. We confirmed that  the stem was in the canal both on AP and lateral x-rays. It also has excellent sizing. The hip was reduced with outstanding stability through full extension and full external rotation.. AP pelvis was taken and the leg lengths were measured and found to be within 1/2 inch of equal which was a tremendous improvement compared to pre-op when he was nearly 2 inches short on the right side.Marland Kitchen Hip was then dislocated again and the femoral head and neck removed. The  femoral broach was removed. Size 12 Corail stem with a high offset  neck was then impacted into the femur following native anteversion. Has  excellent purchase in the canal. Excellent torsional and rotational and  axial stability. It is confirmed to be in the canal on AP and lateral  fluoroscopic views. The 36 + 5 ceramic head was placed and the hip  reduced with outstanding stability. Again AP pelvis was taken and it  confirmed that the leg lengths were unchanged from the trial. The wound was then copiously  irrigated with saline solution and the capsule reattached and repaired  with Ethibond suture. 30 ml of .25% Bupivicaine was  injected into the capsule and into the edge of the tensor fascia lata as well as subcutaneous tissue. The fascia overlying the tensor fascia lata was then closed with a running #1 V-Loc. Subcu was closed with interrupted 2-0  Vicryl and subcuticular running 4-0 Monocryl. Incision was cleaned  and dried. Steri-Strips and a bulky sterile dressing applied. Hemovac  drain was hooked to suction and then the patient was awakened and transported to  recovery in stable condition.        Please note that a surgical assistant was a medical necessity for this procedure to perform it in a safe and expeditious manner. Assistant was necessary to provide appropriate retraction of vital neurovascular structures and to prevent femoral fracture and allow for anatomic placement of the prosthesis.  Gaynelle Arabian, M.D.

## 2016-09-04 NOTE — Anesthesia Procedure Notes (Signed)
Spinal  Patient location during procedure: OR Staffing Performed: anesthesiologist  Preanesthetic Checklist Completed: patient identified, site marked, surgical consent, pre-op evaluation, timeout performed, IV checked, risks and benefits discussed and monitors and equipment checked Spinal Block Patient position: sitting Prep: ChloraPrep Patient monitoring: heart rate, continuous pulse ox and blood pressure Approach: left paramedian Location: L2-3 Injection technique: single-shot Needle Needle type: Sprotte  Needle gauge: 24 G Needle length: 9 cm Additional Notes Expiration date of kit checked and confirmed. Patient tolerated procedure well, without complications.

## 2016-09-04 NOTE — Interval H&P Note (Signed)
History and Physical Interval Note:  09/04/2016 11:51 AM  Charles George  has presented today for surgery, with the diagnosis of RIGHT HIP OA  The various methods of treatment have been discussed with the patient and family. After consideration of risks, benefits and other options for treatment, the patient has consented to  Procedure(s): RIGHT TOTAL HIP ARTHROPLASTY ANTERIOR APPROACH (Right) as a surgical intervention .  The patient's history has been reviewed, patient examined, no change in status, stable for surgery.  I have reviewed the patient's chart and labs.  Questions were answered to the patient's satisfaction.     Gearlean Alf

## 2016-09-04 NOTE — Progress Notes (Signed)
X-ray results noted 

## 2016-09-04 NOTE — Anesthesia Postprocedure Evaluation (Signed)
Anesthesia Post Note  Patient: Charles George  Procedure(s) Performed: Procedure(s) (LRB): RIGHT TOTAL HIP ARTHROPLASTY ANTERIOR APPROACH  WITH ACETABULUM GRAFTING (Right)  Patient location during evaluation: PACU Anesthesia Type: Spinal Level of consciousness: awake and alert Pain management: pain level controlled Vital Signs Assessment: post-procedure vital signs reviewed and stable Respiratory status: spontaneous breathing and respiratory function stable Cardiovascular status: blood pressure returned to baseline and stable Postop Assessment: spinal receding Anesthetic complications: no       Last Vitals:  Vitals:   09/04/16 1807 09/04/16 1900  BP: (!) 115/59 132/62  Pulse: 93 86  Resp: 18 18  Temp: 36.6 C 36.4 C    Last Pain:  Vitals:   09/04/16 1900  TempSrc: Axillary  PainSc:                  [name]

## 2016-09-04 NOTE — Anesthesia Preprocedure Evaluation (Signed)
Anesthesia Evaluation  Patient identified by MRN, date of birth, ID band Patient awake    Reviewed: Allergy & Precautions, NPO status , Patient's Chart, lab work & pertinent test results  History of Anesthesia Complications (+) history of anesthetic complications  Airway Mallampati: II  TM Distance: >3 FB Neck ROM: Full    Dental no notable dental hx.    Pulmonary shortness of breath, asthma , pneumonia, former smoker,    Pulmonary exam normal breath sounds clear to auscultation       Cardiovascular hypertension, Pt. on medications + CAD  Normal cardiovascular exam+ Valvular Problems/Murmurs  Rhythm:Regular Rate:Normal     Neuro/Psych negative neurological ROS  negative psych ROS   GI/Hepatic Neg liver ROS, GERD  ,  Endo/Other  negative endocrine ROS  Renal/GU negative Renal ROS     Musculoskeletal negative musculoskeletal ROS (+) Arthritis ,   Abdominal   Peds  Hematology negative hematology ROS (+)   Anesthesia Other Findings   Reproductive/Obstetrics negative OB ROS                             Anesthesia Physical  Anesthesia Plan  ASA: III  Anesthesia Plan: Spinal   Post-op Pain Management:    Induction: Intravenous  Airway Management Planned: Natural Airway and Simple Face Mask  Additional Equipment:   Intra-op Plan:   Post-operative Plan:   Informed Consent: I have reviewed the patients History and Physical, chart, labs and discussed the procedure including the risks, benefits and alternatives for the proposed anesthesia with the patient or authorized representative who has indicated his/her understanding and acceptance.   Dental advisory given  Plan Discussed with: CRNA and Surgeon  Anesthesia Plan Comments:         Anesthesia Quick Evaluation

## 2016-09-05 LAB — BASIC METABOLIC PANEL
Anion gap: 8 (ref 5–15)
BUN: 20 mg/dL (ref 6–20)
CHLORIDE: 105 mmol/L (ref 101–111)
CO2: 26 mmol/L (ref 22–32)
CREATININE: 1.21 mg/dL (ref 0.61–1.24)
Calcium: 9.2 mg/dL (ref 8.9–10.3)
GFR calc Af Amer: >60 mL/min (ref >60)
GFR calc non Af Amer: 54 mL/min — ABNORMAL LOW (ref >60)
GLUCOSE: 133 mg/dL — AB (ref 65–99)
Potassium: 4.3 mmol/L (ref 3.5–5.1)
SODIUM: 139 mmol/L (ref 135–145)

## 2016-09-05 LAB — CBC
HCT: 34.8 % — ABNORMAL LOW (ref 39.0–52.0)
HEMOGLOBIN: 11.3 g/dL — AB (ref 13.0–17.0)
MCH: 29.1 pg (ref 26.0–34.0)
MCHC: 32.5 g/dL (ref 30.0–36.0)
MCV: 89.7 fL (ref 78.0–100.0)
Platelets: 224 10*3/uL (ref 150–400)
RBC: 3.88 MIL/uL — ABNORMAL LOW (ref 4.22–5.81)
RDW: 14 % (ref 11.5–15.5)
WBC: 17.7 10*3/uL — ABNORMAL HIGH (ref 4.0–10.5)

## 2016-09-05 NOTE — Progress Notes (Signed)
   Subjective: 1 Day Post-Op Procedure(s) (LRB): RIGHT TOTAL HIP ARTHROPLASTY ANTERIOR APPROACH  WITH ACETABULUM GRAFTING (Right) Patient reports pain as mild.   Patient seen in rounds with Dr. Wynelle Link.  Pleasantly confused this morning.  Will stop all narcs.  Possibly sundowning. We will start therapy today.  Plan is to go Home after hospital stay.  Objective: Vital signs in last 24 hours: Temp:  [97.4 F (36.3 C)-97.9 F (36.6 C)] 97.6 F (36.4 C) (03/15 0555) Pulse Rate:  [71-93] 84 (03/15 0555) Resp:  [16-24] 16 (03/15 0555) BP: (110-143)/(51-87) 121/51 (03/15 0555) SpO2:  [93 %-100 %] 93 % (03/15 0555) Weight:  [68.5 kg (151 lb)] 68.5 kg (151 lb) (03/14 0931)  Intake/Output from previous day:  Intake/Output Summary (Last 24 hours) at 09/05/16 0912 Last data filed at 09/05/16 0600  Gross per 24 hour  Intake          4468.75 ml  Output             2470 ml  Net          1998.75 ml    Intake/Output this shift: No intake/output data recorded.  Labs:  Recent Labs  09/05/16 0422  HGB 11.3*    Recent Labs  09/05/16 0422  WBC 17.7*  RBC 3.88*  HCT 34.8*  PLT 224    Recent Labs  09/05/16 0422  NA 139  K 4.3  CL 105  CO2 26  BUN 20  CREATININE 1.21  GLUCOSE 133*  CALCIUM 9.2   No results for input(s): LABPT, INR in the last 72 hours.  EXAM General - Patient is Alert and Confused Extremity - Neurovascular intact Sensation intact distally Dressing - dressing C/D/I Motor Function - intact, moving foot and toes well on exam.  Hemovac drain came out  Past Medical History:  Diagnosis Date  . Arthritis   . Asthma   . Bladder stone   . BPH (benign prostatic hypertrophy)   . Cancer ([place])    melanoma on right arm   . Complication of anesthesia    10/19/2014 following knee surgery pt states did not awaken for a day and half  . Coronary artery disease   . Falls   . Foley catheter in place    hx of  . GERD (gastroesophageal reflux disease)   . Heart  murmur   . History of chicken pox   . History of kidney stones   . History of radiation therapy   . Hypertension    no meds  . Pneumonia    09/2014   . Shortness of breath dyspnea    increased exertion   . Tingling    hands bilat   . Urinary tract infection    hx of     Assessment/Plan: 1 Day Post-Op Procedure(s) (LRB): RIGHT TOTAL HIP ARTHROPLASTY ANTERIOR APPROACH  WITH ACETABULUM GRAFTING (Right) Principal Problem:   OA (osteoarthritis) of hip  Estimated body mass index is 19.39 kg/m as calculated from the following:   Height as of this encounter: 6\' 2"  (1.88 m).   Weight as of this encounter: 68.5 kg (151 lb). Advance diet Up with therapy Discharge home with home health when ready and improved  DVT Prophylaxis - Xarelto Weight Bearing As Tolerated right Leg Hemovac Out Begin Therapy Stop Narcs Monitor progress.  Arlee Muslim, PA-C Orthopaedic Surgery 09/05/2016, 9:12 AM

## 2016-09-05 NOTE — Care Management Note (Signed)
Case Management Note  Patient Details  Name: Charles George MRN: [id number] Date of Birth: [birth year]  Subjective/Objective:                  RIGHT TOTAL HIP ARTHROPLASTY ANTERIOR APPROACH  WITH ACETABULUM GRAFTING (Right) Action/Plan: Discharge planning Expected Discharge Date:                  Expected Discharge Plan:  [place]  In-House Referral:     Discharge planning Services  CM Consult  Post Acute Care Choice:  Home Health Choice offered to:  Patient, Adult Children  DME Arranged:  N/A DME Agency:  NA  HH Arranged:  PT [place] Agency:  [facility] (formerly [employer])  Status of Service:  Completed, signed off  If discussed at [employer], dates discussed:    Additional Comments: CM met with pt and 2 nephews of pt to offer choice and family choose [facility] to render HHPT.  Referral caled to [facility] rep, family states pt has both a rolling walker and an elevated commode with safety handles at home. No other CM needs were communicated. Dellie Catholic, RN 09/05/2016, 12:24 PM

## 2016-09-05 NOTE — Progress Notes (Signed)
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      PT Comments    Pt progressing steadily with mobility and following cues more consistently.  Pt states he wants to dc home from hospital but does not have 24/7 assist arranged at this time.   Follow Up Recommendations  Home health PT;SNF     Equipment Recommendations  None recommended by PT    Recommendations for Other Services OT consult     Precautions / Restrictions Precautions Precautions: Fall Precaution Comments: Pt very confused earlier this am stating he was in jail.   Restrictions Weight Bearing Restrictions: No Other Position/Activity Restrictions: WBAT    Mobility  Bed Mobility Overal bed mobility: Needs Assistance Bed Mobility: Supine to Sit     Supine to sit: Supervision     General bed mobility comments: min cues for sequence  Transfers Overall transfer level: Needs assistance Equipment used: Rolling walker (2 wheeled) Transfers: Sit to/from Stand Sit to Stand: Min assist         General transfer comment: cues for LE management, safety awareness and use of UEs to self assist  Ambulation/Gait Ambulation/Gait assistance: Min assist Ambulation Distance (Feet): 200 Feet Assistive device: Rolling walker (2 wheeled) Gait Pattern/deviations: Step-to pattern;Narrow base of support;Antalgic;Decreased step length - right;Decreased step length - left;Shuffle Gait velocity: decr Gait velocity interpretation: at or above normal speed for age/gender General Gait Details: cues for sequence, posture, position from RW and to increase BOS   Stairs            Wheelchair Mobility    Modified Rankin (Stroke Patients Only)       Balance Overall balance assessment: Needs assistance Sitting-balance support: No upper extremity supported;Feet  supported Sitting balance-Leahy Scale: Good     Standing balance support: Bilateral upper extremity supported Standing balance-Leahy Scale: Fair                      Cognition Arousal/Alertness: Awake/alert Behavior During Therapy: WFL for tasks assessed/performed Overall Cognitive Status: Within Functional Limits for tasks assessed Area of Impairment: Orientation;Memory Orientation Level: Time;Place;Situation                  Exercises Total Joint Exercises Ankle Circles/Pumps: AROM;Both;20 reps;Supine Heel Slides: AAROM;Right;20 reps;Supine Hip ABduction/ADduction: AAROM;Right;15 reps;Supine Long Arc Quad: AAROM;Right;Seated;10 reps    General Comments        Pertinent Vitals/Pain      Home Living Family/patient expects to be discharged to:: Private residence Living Arrangements: Alone Available Help at Discharge: Family;Available PRN/intermittently Type of Home: House Home Access: Stairs to enter Entrance Stairs-Rails: Left Home Layout: One level Home Equipment: Walker - 2 wheels;Cane - single point      Prior Function Level of Independence: Independent with assistive device(s)      Comments: Pt used RW - my balance is not good   PT Goals (current goals can now be found in the care plan section) Acute Rehab PT Goals Patient Stated Goal: Walk better than before surgery PT Goal Formulation: With patient Time For Goal Achievement: 09/09/16 Potential to Achieve Goals: Fair Progress towards PT goals: Progressing toward goals    Frequency    7X/week      PT Plan Current plan remains appropriate    Co-evaluation  End of Session Equipment Utilized During Treatment: Gait belt Activity Tolerance: Patient tolerated treatment well Patient left: in bed;with call bell/phone within reach;with family/visitor present Nurse Communication: Mobility status PT Visit Diagnosis: Difficulty in walking, not elsewhere classified (R26.2)      Time: 1601-0932 PT Time Calculation (min) (ACUTE ONLY): 32 min  Charges:  $Gait Training: 8-22 mins $Therapeutic Exercise: 8-22 mins                    G Codes:       Lacara Dunsworth October 04, 2016, 4:33 PM

## 2016-09-05 NOTE — Evaluation (Signed)
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    Clinical Impression  Pt s/p R THR and presents with decreased R LE strength/ROM, post op pain and post op confusion limiting functional mobility.  Pt hopes to dc home with follow up HHPT but does not have 24/7 assist arranged.    Follow Up Recommendations Home health PT;SNF (dependent on acute stay progress and level of assist at home)    Equipment Recommendations  None recommended by PT    Recommendations for Other Services OT consult     Precautions / Restrictions Precautions Precautions: Fall Precaution Comments: Pt very confused earlier this am stating he was in jail.   Restrictions Weight Bearing Restrictions: No Other Position/Activity Restrictions: WBAT      Mobility  Bed Mobility               General bed mobility comments: NT - OOB with nursing  Transfers Overall transfer level: Needs assistance Equipment used: Rolling walker (2 wheeled) Transfers: Sit to/from Stand Sit to Stand: Min assist         General transfer comment: cues for LE management, safety awareness and use of UEs to self assist  Ambulation/Gait Ambulation/Gait assistance: Min assist Ambulation Distance (Feet): 140 Feet Assistive device: Rolling walker (2 wheeled) Gait Pattern/deviations: Step-to pattern;Narrow base of support;Antalgic;Decreased step length - right;Decreased step length - left;Shuffle Gait velocity: decr Gait velocity interpretation: Below normal speed for age/gender General Gait Details: cues for sequence, posture, position from RW and to increase BOS  Stairs            Wheelchair Mobility    Modified Rankin (Stroke Patients Only)       Balance Overall balance assessment: Needs assistance Sitting-balance support: No upper extremity supported;Feet  supported Sitting balance-Leahy Scale: Good     Standing balance support: Bilateral upper extremity supported Standing balance-Leahy Scale: Poor                               Pertinent Vitals/Pain      Home Living Family/patient expects to be discharged to:: Private residence Living Arrangements: Alone Available Help at Discharge: Family;Available PRN/intermittently Type of Home: House Home Access: Stairs to enter Entrance Stairs-Rails: Left Entrance Stairs-Number of Steps: 4 Home Layout: One level Home Equipment: Walker - 2 wheels;Cane - single point      Prior Function Level of Independence: Independent with assistive device(s)         Comments: Pt used RW - my balance is not good     Hand Dominance        Extremity/Trunk Assessment   Upper Extremity Assessment Upper Extremity Assessment: Overall WFL for tasks assessed    Lower Extremity Assessment Lower Extremity Assessment: RLE deficits/detail       Communication   Communication: No difficulties  Cognition Arousal/Alertness: Awake/alert Behavior During Therapy: WFL for tasks assessed/performed Overall Cognitive Status: Impaired/Different from baseline Area of Impairment: Orientation;Memory Orientation Level: Time;Place;Situation                  General Comments      Exercises     Assessment/Plan    PT Assessment Patient needs continued PT services  PT Problem List Decreased strength;Decreased range of motion;Decreased activity tolerance;Decreased balance;Decreased mobility;Decreased knowledge of use of DME;Pain;Decreased safety  awareness;Decreased cognition       PT Treatment Interventions DME instruction;Gait training;Stair training;Functional mobility training;Therapeutic activities;Therapeutic exercise;Patient/family education;Cognitive remediation    PT Goals (Current goals can be found in the Care Plan section)  Acute Rehab PT Goals Patient Stated Goal: Walk better  than before surgery PT Goal Formulation: With patient Time For Goal Achievement: 09/09/16 Potential to Achieve Goals: Fair    Frequency 7X/week   Barriers to discharge Decreased caregiver support Pt and nephew report "a lady that helps during the day but goes home at night"    Co-evaluation               End of Session Equipment Utilized During Treatment: Gait belt Activity Tolerance: Patient tolerated treatment well Patient left: in chair;with call bell/phone within reach;with chair alarm set;with family/visitor present Nurse Communication: Mobility status PT Visit Diagnosis: Difficulty in walking, not elsewhere classified (R26.2)         Time: 3818-4037 PT Time Calculation (min) (ACUTE ONLY): 23 min   Charges:   PT Evaluation $PT Eval Low Complexity: 1 Procedure PT Treatments $Gait Training: 8-22 mins   PT G Codes:         Charles George 09/05/2016, 1:45 PM

## 2016-09-05 NOTE — Progress Notes (Signed)
OT Cancellation Note  Patient Details Name: Charles George MRN: [id number] DOB: May 30, 1933   Cancelled Treatment:     Reason pt not seen for Occupational Therapy: Orders received and chart reviewed. OT spoke with nursing whom states pt is not medically ready secondary to confusion, agitation this morning. Will check back for OT assessment when pt is medically able and as time allows.  Carlynn Herald Ernest Orr Beth Dixon, OTR/L 09/05/2016, 9:01 AM

## 2016-09-05 NOTE — Discharge Instructions (Signed)
Information on my medicine - XARELTO (Rivaroxaban)  This medication education was reviewed with me or my healthcare representative as part of my discharge preparation.  The pharmacist that spoke with me during my hospital stay was:  Tommie Raymond, Student-PharmD  Why was Xarelto prescribed for you? Xarelto was prescribed for you to reduce the risk of blood clots forming after orthopedic surgery. The medical term for these abnormal blood clots is venous thromboembolism (VTE).  What do you need to know about xarelto ? Take your Xarelto ONCE DAILY at the same time every day. You may take it either with or without food.  If you have difficulty swallowing the tablet whole, you may crush it and mix in applesauce just prior to taking your dose.  Take Xarelto exactly as prescribed by your doctor and DO NOT stop taking Xarelto without talking to the doctor who prescribed the medication.  Stopping without other VTE prevention medication to take the place of Xarelto may increase your risk of developing a clot.  After discharge, you should have regular check-up appointments with your healthcare provider that is prescribing your Xarelto.    What do you do if you miss a dose? If you miss a dose, take it as soon as you remember on the same day then continue your regularly scheduled once daily regimen the next day. Do not take two doses of Xarelto on the same day.   Important Safety Information A possible side effect of Xarelto is bleeding. You should call your healthcare provider right away if you experience any of the following: ? Bleeding from an injury or your nose that does not stop. ? Unusual colored urine (red or dark brown) or unusual colored stools (red or black). ? Unusual bruising for unknown reasons. ? A serious fall or if you hit your head (even if there is no bleeding).  Some medicines may interact with Xarelto and might increase your risk of bleeding while on Xarelto. To help  avoid this, consult your healthcare provider or pharmacist prior to using any new prescription or non-prescription medications, including herbals, vitamins, non-steroidal anti-inflammatory drugs (NSAIDs) and supplements.  This website has more information on Xarelto: [email or web address]

## 2016-09-06 LAB — BASIC METABOLIC PANEL
Anion gap: 8 (ref 5–15)
BUN: 26 mg/dL — AB (ref 6–20)
CALCIUM: 9.3 mg/dL (ref 8.9–10.3)
CO2: 26 mmol/L (ref 22–32)
CREATININE: 1.22 mg/dL (ref 0.61–1.24)
Chloride: 104 mmol/L (ref 101–111)
GFR calc Af Amer: >60 mL/min (ref >60)
GFR calc non Af Amer: 54 mL/min — ABNORMAL LOW (ref >60)
GLUCOSE: 110 mg/dL — AB (ref 65–99)
Potassium: 4.1 mmol/L (ref 3.5–5.1)
Sodium: 138 mmol/L (ref 135–145)

## 2016-09-06 LAB — CBC
HEMATOCRIT: 31.4 % — AB (ref 39.0–52.0)
Hemoglobin: 10.5 g/dL — ABNORMAL LOW (ref 13.0–17.0)
MCH: 30 pg (ref 26.0–34.0)
MCHC: 33.4 g/dL (ref 30.0–36.0)
MCV: 89.7 fL (ref 78.0–100.0)
Platelets: 198 10*3/uL (ref 150–400)
RBC: 3.5 MIL/uL — ABNORMAL LOW (ref 4.22–5.81)
RDW: 14.3 % (ref 11.5–15.5)
WBC: 17.5 10*3/uL — ABNORMAL HIGH (ref 4.0–10.5)

## 2016-09-06 MED ORDER — RIVAROXABAN 10 MG PO TABS: 10.0000 mg | ORAL_TABLET | Freq: Every day | ORAL | 0 refills | Status: DC

## 2016-09-06 MED ORDER — LIP MEDEX EX OINT
TOPICAL_OINTMENT | CUTANEOUS | Status: AC
  Administered 2016-09-06: 10:00:00
  Filled 2016-09-06: qty 7

## 2016-09-06 MED ORDER — ACETAMINOPHEN 325 MG PO TABS: 650.0000 mg | ORAL_TABLET | Freq: Four times a day (QID) | ORAL | 0 refills | Status: DC | PRN

## 2016-09-06 NOTE — Evaluation (Signed)
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     Clinical Impression   Pt admitted with the above diagnoses and presents with below problem list. Pt will benefit from continued acute OT to address the below listed deficits and maximize independence with basic ADLs. PTA pt was mod I with ADLs. Pt is currently mod-max A with transfers and LB ADLs. Pt very unsteady this morning. Per nephew and chart review pt is needing more assistance with transfers on OT evaluation. Pt with 3 LOB during sit<>stands from recliner needing mod-max A to control descent back into recliner. Fatigued quickly. Also of note, pt initiating scooting to end of recliner to stand without putting leg raiser down first. Cueing for safety. Nephew present and discussed d/c plan. He reports he has arranged for someone to stay with pt all the time. Discussed OT recommendation of caregiver having a gait belt for use with pt. Pt appropriate for [facility] for rehab but declining SNF at this time.      Follow Up Recommendations  Home health OT;SNF;Supervision/Assistance - 24 hour    Equipment Recommendations  None recommended by OT    Recommendations for Other Services PT consult     Precautions / Restrictions Precautions Precautions: Fall Precaution Comments: Confusion after surgery Restrictions Weight Bearing Restrictions: No Other Position/Activity Restrictions: WBAT      Mobility Bed Mobility               General bed mobility comments: up in chair  Transfers Overall transfer level: Needs assistance Equipment used: Rolling walker (2 wheeled) Transfers: Sit to/from Stand Sit to Stand: Mod assist;Max assist         General transfer comment: cues for technique and safety (attempting to scoot out of recliner with leg riser up). Mod A from recliner for powerup  and steadying. 3x needed mod-max A for uncontrolled descent into recliner. Fatigued quickly.      Balance Overall balance assessment: Needs assistance Sitting-balance support: No upper extremity supported;Feet supported Sitting balance-Leahy Scale: Good     Standing balance support: Bilateral upper extremity supported Standing balance-Leahy Scale: Poor Standing balance comment: poor static standing balance. rw and some assistance to steady.                            ADL Overall ADL's : Needs assistance/impaired Eating/Feeding: Set up;Sitting   Grooming: Set up;Sitting   Upper Body Bathing: Set up;Sitting   Lower Body Bathing: Moderate assistance;Sit to/from stand;Cueing for compensatory techniques;Maximal assistance   Upper Body Dressing : Set up;Sitting;Cueing for safety   Lower Body Dressing: Moderate assistance;Sit to/from stand;Cueing for compensatory techniques;Cueing for safety;Maximal assistance   Toilet Transfer: Moderate assistance;Stand-pivot;BSC;RW;Maximal assistance   Toileting- Clothing Manipulation and Hygiene: Moderate assistance;Maximal assistance;Sit to/from stand       Functional mobility during ADLs: Moderate assistance;Rolling walker (walked in place with recliner behind him) General ADL Comments: Pt completed 3 sit<>stands from recliner mod A for powerup and steadying, max A due to 3x uncontrolled descent back into recliner. Pt completed walking in place with recliner behind him. Fatigued quickly. Discussed d/c plan with nephew including recommendation for caregiver to have a gait belt for use with pt. Deferred further functional mobility at this time due to poor static standing balance.      Vision  Perception     Praxis      Pertinent Vitals/Pain Pain Assessment: Faces Faces Pain Scale: Hurts even more Pain Location: R hip during transfers Pain Descriptors / Indicators: Grimacing Pain Intervention(s): Limited activity within  patient's tolerance;Monitored during session;Repositioned;Other (comment) (Pt declined pain med)     Hand Dominance     Extremity/Trunk Assessment Upper Extremity Assessment Upper Extremity Assessment: Generalized weakness   Lower Extremity Assessment Lower Extremity Assessment: Defer to PT evaluation       Communication Communication Communication: No difficulties   Cognition Arousal/Alertness: Awake/alert Behavior During Therapy: WFL for tasks assessed/performed Overall Cognitive Status: Within Functional Limits for tasks assessed Area of Impairment: Orientation;Memory Orientation Level: Time;Place;Situation                 General Comments       Exercises       Shoulder Instructions      Home Living Family/patient expects to be discharged to:: Private residence Living Arrangements: Alone Available Help at Discharge: Family;Available PRN/intermittently Type of Home: House Home Access: Stairs to enter [employer] of Steps: 4 Entrance Stairs-Rails: Left Home Layout: One level               Home Equipment: Walker - 2 wheels;Cane - single point;Bedside commode   Additional Comments: Pt's nephew reports he has arranged 24 hour coverage.      Prior Functioning/Environment Level of Independence: Independent with assistive device(s)        Comments: Pt used RW - my balance is not good        OT Problem List: Impaired balance (sitting and/or standing);Decreased activity tolerance;Decreased strength;Decreased safety awareness;Decreased knowledge of use of DME or AE;Decreased knowledge of precautions;Pain;Decreased cognition      OT Treatment/Interventions: Self-care/ADL training;DME and/or AE instruction;Therapeutic activities;Patient/family education;Balance training;Energy conservation    OT Goals(Current goals can be found in the care plan section) Acute Rehab OT Goals Patient Stated Goal: Walk better than before surgery OT Goal  Formulation: With patient/family Time For Goal Achievement: 09/13/16 Potential to Achieve Goals: Good ADL Goals Pt Will Perform Grooming: with min guard assist;standing Pt Will Perform Lower Body Bathing: with min assist;sit to/from stand Pt Will Perform Lower Body Dressing: with min assist;sit to/from stand Pt Will Transfer to Toilet: ambulating;with min guard assist Pt Will Perform Toileting - Clothing Manipulation and hygiene: with min assist;sit to/from stand Additional ADL Goal #1: Pt will complete bed mobility at min guard level to prepare for OOB ADLs.   OT Frequency: Min 3X/week   Barriers to D/C:    Pt's nephew reports he has arranged 24/7 care.        Co-evaluation              End of Session Equipment Utilized During Treatment: Gait belt;Rolling walker Nurse Communication: Other (comment);Mobility status (balance)  Activity Tolerance: Patient limited by fatigue Patient left: in chair;with call bell/phone within reach;with family/visitor present  OT Visit Diagnosis: Pain;Unsteadiness on feet (R26.81) Pain - Right/Left: Right Pain - part of body: Hip                ADL either performed or assessed with clinical judgement  Time: 1020-1041 OT Time Calculation (min): 21 min Charges:  OT General Charges $OT Visit: 1 Procedure OT Evaluation $OT Eval Low Complexity: 1 Procedure G-Codes:       Hortencia Pilar 09/06/2016, 11:10 AM

## 2016-09-06 NOTE — Progress Notes (Signed)
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      PT Comments    Pt continues motivated to dc home.  Noted improvement in activity tolerance and balance vs am but pt still unsteady on feet and with very questionable safety awareness.   Follow Up Recommendations  Home health PT;SNF     Equipment Recommendations  None recommended by PT    Recommendations for Other Services OT consult     Precautions / Restrictions Precautions Precautions: Fall Precaution Comments: Confusion after surgery Restrictions Weight Bearing Restrictions: No Other Position/Activity Restrictions: WBAT    Mobility  Bed Mobility Overal bed mobility: Needs Assistance Bed Mobility: Sit to Supine       Sit to supine: Supervision   General bed mobility comments: cues for safety  Transfers Overall transfer level: Needs assistance Equipment used: Rolling walker (2 wheeled) Transfers: Sit to/from Stand Sit to Stand: Mod assist         General transfer comment: cues for technique and safety.  Mod assist to bring wt up and fwd and mod assist to prevent fall with pt attempting to sit before stepping back to bed  Ambulation/Gait Ambulation/Gait assistance: Min assist Ambulation Distance (Feet): 240 Feet Assistive device: Rolling walker (2 wheeled) Gait Pattern/deviations: Step-to pattern;Narrow base of support;Antalgic;Decreased step length - right;Decreased step length - left;Shuffle;Leaning posteriorly Gait velocity: decr Gait velocity interpretation: Below normal speed for age/gender General Gait Details: cues for sequence, posture, position from RW and to increase BOS.  Pt required intermittent assist to retain balance but improved from this am.   Stairs            Wheelchair Mobility    Modified Rankin (Stroke Patients Only)        Balance Overall balance assessment: Needs assistance Sitting-balance support: No upper extremity supported;Feet supported Sitting balance-Leahy Scale: Good     Standing balance support: Bilateral upper extremity supported Standing balance-Leahy Scale: Poor Standing balance comment: poor static standing balance. rw and some assistance to steady.                    Cognition Arousal/Alertness: Awake/alert Behavior During Therapy: WFL for tasks assessed/performed Overall Cognitive Status: Within Functional Limits for tasks assessed Area of Impairment: Orientation;Memory                    Exercises      General Comments        Pertinent Vitals/Pain Pain Assessment: Faces Faces Pain Scale: Hurts little more Pain Location: R hip during transfers Pain Descriptors / Indicators: Sore Pain Intervention(s): Limited activity within patient's tolerance;Monitored during session;Premedicated before session;Ice applied    Home Living                      Prior Function            PT Goals (current goals can now be found in the care plan section) Acute Rehab PT Goals Patient Stated Goal: Walk better than before surgery PT Goal Formulation: With patient Time For Goal Achievement: 09/09/16 Potential to Achieve Goals: Fair Progress towards PT goals: Progressing toward goals    Frequency    7X/week      PT Plan Current plan remains appropriate    Co-evaluation  End of Session Equipment Utilized During Treatment: Gait belt Activity Tolerance: Patient tolerated treatment well Patient left: in bed;with call bell/phone within reach;with bed alarm set Nurse Communication: Mobility status PT Visit Diagnosis: Difficulty in walking, not elsewhere classified (R26.2)     Time: 4944-9675 PT Time Calculation (min) (ACUTE ONLY): 23 min  Charges:  $Gait Training: 23-37 mins                    G Codes:       Charles George 2016-09-30,  4:53 PM

## 2016-09-06 NOTE — Progress Notes (Signed)
Subjective: 2 Days Post-Op Procedure(s) (LRB): RIGHT TOTAL HIP ARTHROPLASTY ANTERIOR APPROACH  WITH ACETABULUM GRAFTING (Right) Patient reports pain as mild.   Patient seen in rounds for Dr. Wynelle Link.  Still with some confusion but reorients with some help. Off all narcotics.  Walking well but wants to make sure that is continuing to clear. Patient is well, but has had some minor complaints of pain in the hip, requiring pain medications Plan is to go Home after hospital stay.  He will have help at home.  His nephew, J.W., will be with him at home following th hospital stay.  Objective: Vital signs in last 24 hours: Temp:  [97.3 F (36.3 C)-98.1 F (36.7 C)] 97.8 F (36.6 C) (03/16 0502) Pulse Rate:  [93-110] 98 (03/16 0502) Resp:  [16-18] 17 (03/16 0502) BP: (119-140)/(53-84) 128/70 (03/16 0502) SpO2:  [95 %-98 %] 98 % (03/16 0502)  Intake/Output from previous day:  Intake/Output Summary (Last 24 hours) at 09/06/16 0800 Last data filed at 09/06/16 0503  Gross per 24 hour  Intake              540 ml  Output             1410 ml  Net             -870 ml    Intake/Output this shift: No intake/output data recorded.  Labs:  Recent Labs  09/05/16 0422 09/06/16 0423  HGB 11.3* 10.5*    Recent Labs  09/05/16 0422 09/06/16 0423  WBC 17.7* 17.5*  RBC 3.88* 3.50*  HCT 34.8* 31.4*  PLT 224 198    Recent Labs  09/05/16 0422 09/06/16 0423  NA 139 138  K 4.3 4.1  CL 105 104  CO2 26 26  BUN 20 26*  CREATININE 1.21 1.22  GLUCOSE 133* 110*  CALCIUM 9.2 9.3   No results for input(s): LABPT, INR in the last 72 hours.  EXAM General - Patient is Alert, Appropriate and Oriented Extremity - Neurovascular intact Sensation intact distally Intact pulses distally Dorsiflexion/Plantar flexion intact Dressing/Incision - clean, dry, no drainage Motor Function - intact, moving foot and toes well on exam.   Past Medical History:  Diagnosis Date  . Arthritis   . Asthma     . Bladder stone   . BPH (benign prostatic hypertrophy)   . Cancer ([place])    melanoma on right arm   . Complication of anesthesia    10/19/2014 following knee surgery pt states did not awaken for a day and half  . Coronary artery disease   . Falls   . Foley catheter in place    hx of  . GERD (gastroesophageal reflux disease)   . Heart murmur   . History of chicken pox   . History of kidney stones   . History of radiation therapy   . Hypertension    no meds  . Pneumonia    09/2014   . Shortness of breath dyspnea    increased exertion   . Tingling    hands bilat   . Urinary tract infection    hx of     Assessment/Plan: 2 Days Post-Op Procedure(s) (LRB): RIGHT TOTAL HIP ARTHROPLASTY ANTERIOR APPROACH  WITH ACETABULUM GRAFTING (Right) Principal Problem:   OA (osteoarthritis) of hip  Estimated body mass index is 19.39 kg/m as calculated from the following:   Height as of this encounter: 6\' 2"  (1.88 m).   Weight as of this encounter: 68.5 kg (  151 lb). Up with therapy Plan for discharge tomorrow  DVT Prophylaxis - Xarelto Weight-Bearing as tolerated to right leg  Arlee Muslim, PA-C Orthopaedic Surgery 09/06/2016, 8:00 AM

## 2016-09-06 NOTE — Progress Notes (Signed)
Physical Therapy Treatment Patient Details Name: Charles George MRN: [id number] DOB: 1932-08-07 Today's Date: 09/06/2016    History of Present Illness Pt s/p R THR and with hx of L TKR (16).      PT Comments    Pt continues cooperative but ltd this am by noted decline in activity tolerance and ambulatory balance.   Follow Up Recommendations  Home health PT;SNF     Equipment Recommendations  None recommended by PT    Recommendations for Other Services OT consult     Precautions / Restrictions Precautions Precautions: Fall Precaution Comments: Confusion after surgery Restrictions Weight Bearing Restrictions: No Other Position/Activity Restrictions: WBAT    Mobility  Bed Mobility               General bed mobility comments: Up in chair  Transfers Overall transfer level: Needs assistance Equipment used: Rolling walker (2 wheeled) Transfers: Sit to/from Stand Sit to Stand: Mod assist         General transfer comment: cues for technique and safety (attempting to scoot out of recliner with leg riser up). Mod A from recliner for powerup and steadying. 3x needed mod-max A for uncontrolled descent into recliner. Fatigued quickly.    Ambulation/Gait Ambulation/Gait assistance: Min assist;Mod assist Ambulation Distance (Feet): 75 Feet Assistive device: Rolling walker (2 wheeled) Gait Pattern/deviations: Step-to pattern;Narrow base of support;Antalgic;Decreased step length - right;Decreased step length - left;Shuffle;Leaning posteriorly Gait velocity: decr Gait velocity interpretation: Below normal speed for age/gender General Gait Details: cues for sequence, posture, position from RW and to increase BOS.  Pt with noted increased instability requiring assistance to manage RW and correct for posterior lean   Stairs            Wheelchair Mobility    Modified Rankin (Stroke Patients Only)       Balance Overall balance assessment: Needs  assistance Sitting-balance support: No upper extremity supported;Feet supported Sitting balance-Leahy Scale: Good     Standing balance support: Bilateral upper extremity supported Standing balance-Leahy Scale: Poor Standing balance comment: poor static standing balance. rw and some assistance to steady.                    Cognition Arousal/Alertness: Awake/alert Behavior During Therapy: WFL for tasks assessed/performed Overall Cognitive Status: Within Functional Limits for tasks assessed Area of Impairment: Orientation;Memory Orientation Level: Time;Place;Situation                  Exercises Total Joint Exercises Ankle Circles/Pumps: AROM;Both;20 reps;Supine Quad Sets: AROM;Both;10 reps;Supine Heel Slides: AAROM;Right;20 reps;Supine Hip ABduction/ADduction: AAROM;Right;15 reps;Supine Long Arc Quad: AAROM;Right;Seated;10 reps    General Comments        Pertinent Vitals/Pain Pain Assessment: Faces Faces Pain Scale: Hurts even more Pain Location: R hip during transfers Pain Descriptors / Indicators: Grimacing Pain Intervention(s): Limited activity within patient's tolerance;Monitored during session;Patient requesting pain meds-RN notified;Ice applied    Home Living Family/patient expects to be discharged to:: Private residence Living Arrangements: Alone Available Help at Discharge: Family;Available PRN/intermittently Type of Home: House Home Access: Stairs to enter Entrance Stairs-Rails: Left Home Layout: One level Home Equipment: Walker - 2 wheels;Cane - single point;Bedside commode Additional Comments: Pt's nephew reports he has arranged 24 hour coverage.    Prior Function Level of Independence: Independent with assistive device(s)      Comments: Pt used RW - my balance is not good   PT Goals (current goals can now be found in the care plan section) Acute Rehab PT  Goals Patient Stated Goal: Walk better than before surgery PT Goal Formulation: With  patient Time For Goal Achievement: 09/09/16 Potential to Achieve Goals: Fair Progress towards PT goals: Not progressing toward goals - comment (balance deficits, poor safety awareness)    Frequency    7X/week      PT Plan Current plan remains appropriate    Co-evaluation             End of Session Equipment Utilized During Treatment: Gait belt Activity Tolerance: Patient tolerated treatment well Patient left: in chair;with call bell/phone within reach;with chair alarm set;with family/visitor present Nurse Communication: Mobility status PT Visit Diagnosis: Difficulty in walking, not elsewhere classified (R26.2)     Time: 6384-6659 PT Time Calculation (min) (ACUTE ONLY): 28 min  Charges:  $Gait Training: 8-22 mins $Therapeutic Exercise: 8-22 mins                    G Codes:       Joliene Salvador 10/01/2016, 1:08 PM

## 2016-09-07 LAB — CBC
HEMATOCRIT: 30.7 % — AB (ref 39.0–52.0)
Hemoglobin: 10.2 g/dL — ABNORMAL LOW (ref 13.0–17.0)
MCH: 29.8 pg (ref 26.0–34.0)
MCHC: 33.2 g/dL (ref 30.0–36.0)
MCV: 89.8 fL (ref 78.0–100.0)
Platelets: 182 10*3/uL (ref 150–400)
RBC: 3.42 MIL/uL — AB (ref 4.22–5.81)
RDW: 14.8 % (ref 11.5–15.5)
WBC: 12.1 10*3/uL — ABNORMAL HIGH (ref 4.0–10.5)

## 2016-09-07 NOTE — Progress Notes (Signed)
Occupational Therapy Treatment Patient Details Name: Charles George MRN: [id number] DOB: 1933/01/21 Today's Date: 09/07/2016    History of present illness Pt s/p R THR and with hx of L TKR (16).     OT comments  Pt demonstrating impulsivity with urinary urgency. Min guard assist and verbal cues for safety needed as pt ambulated to bathroom. Stood at sink for one grooming activity. Educated pt and nephew a length in safety and fall prevention with both verbalizing understanding. Pt will have 24 hour assist at home and is agreeable to very close supervision for all mobility of his nephew or caregiver.   Follow Up Recommendations  Home health OT;Supervision/Assistance - 24 hour    Equipment Recommendations  None recommended by OT    Recommendations for Other Services      Precautions / Restrictions Precautions Precautions: Fall Precaution Comments: Confusion after surgery Restrictions Weight Bearing Restrictions: No Other Position/Activity Restrictions: WBAT       Mobility Bed Mobility Overal bed mobility: Needs Assistance Bed Mobility: Supine to Sit     Supine to sit: Supervision     General bed mobility comments: pt in chair  Transfers Overall transfer level: Needs assistance Equipment used: Rolling walker (2 wheeled) Transfers: Sit to/from Stand Sit to Stand: Min guard         General transfer comment: cues for safe transition position and use of UEs to self assist    Balance   Sitting-balance support: No upper extremity supported;Feet supported Sitting balance-Leahy Scale: Good     Standing balance support: Bilateral upper extremity supported Standing balance-Leahy Scale: Poor                     ADL Overall ADL's : Needs assistance/impaired     Grooming: Wash/dry hands;Standing;Supervision/safety Grooming Details (indicate cue type and reason): instructed in walker placement at sink                 Toilet Transfer: Min  Marine scientist Details (indicate cue type and reason): stood to urinate Toileting- Water quality scientist and Hygiene: Min guard (standing) Toileting - Clothing Manipulation Details (indicate cue type and reason): provided urinals for home, instructed pt to use urinal at night   Tub/Shower Transfer Details (indicate cue type and reason): Demonstrated posterior transfer into shower, pt does not trust suction cup grab bars nephew installed, has built in seats, but may also use his 3 in 1. Functional mobility during ADLs: Min guard;Rolling walker (cues for safety) General ADL Comments: Educated pt and nephew in safe transport of items with walker. Pt has a 4WW with a bag and a standard walker with a piece of plywood across it, PT made aware. Educated in safe footwear and prevention of falls. Nephew informed that AE may be purchased here in the gift shop.      Vision                     Perception     Praxis      Cognition   Behavior During Therapy: Impulsive Overall Cognitive Status: Within Functional Limits for tasks assessed Area of Impairment: Safety/judgement Orientation Level: Time;Place;Situation        Safety/Judgement: Decreased awareness of safety     General Comments: educated pt and nephew at length in fall prevention, importance of supervision with mobility      Exercises Total Joint Exercises Ankle Circles/Pumps: AROM;Both;20 reps;Supine Quad Sets: AROM;Both;10 reps;Supine Heel Slides: AAROM;Right;20 reps;Supine Hip ABduction/ADduction: AAROM;Right;15  reps;Supine   Shoulder Instructions       General Comments      Pertinent Vitals/ Pain       Pain Assessment: Faces Faces Pain Scale: Hurts a little bit Pain Location: R hip with sit to stand Pain Descriptors / Indicators: Sore Pain Intervention(s): Monitored during session;Repositioned  Home Living                                          Prior  Functioning/Environment              Frequency  Min 3X/week        Progress Toward Goals  OT Goals(current goals can now be found in the care plan section)  Progress towards OT goals: Progressing toward goals  Acute Rehab OT Goals Patient Stated Goal: Walk better than before surgery Time For Goal Achievement: 09/13/16 Potential to Achieve Goals: Good  Plan Discharge plan needs to be updated    Co-evaluation                 End of Session Equipment Utilized During Treatment: Gait belt;Rolling walker  OT Visit Diagnosis: Pain;Unsteadiness on feet (R26.81) Pain - Right/Left: Right Pain - part of body: Hip   Activity Tolerance Patient tolerated treatment well   Patient Left in chair;with call bell/phone within reach;with family/visitor present   Nurse Communication          Time: 9355-2174 OT Time Calculation (min): 24 min  Charges: OT General Charges $OT Visit: 1 Procedure OT Treatments $Self Care/Home Management : 23-37 mins    Malka So 09/07/2016, 1:13 PM  [phone]

## 2016-09-07 NOTE — Care Management Important Message (Signed)
Important Message  Patient Details  Name: Charles George MRN: [id number] Date of Birth: July 16, 1932   Medicare Important Message Given:  Yes    Erenest Rasher, RN 09/07/2016, 10:32 AM

## 2016-09-07 NOTE — Progress Notes (Signed)
NCM spoke to pt and nephew, JW Bellows POA at bedside. Pt has RW and cane at home. Contacted [facility] with nephew contact numbers, home # [phone] cell [phone].Jonnie Finner RN CCM Case Mgmt phone [phone]

## 2016-09-07 NOTE — Progress Notes (Signed)
Pt to d/c home with [facility] for PT. Nephew to take patient home. Discussed fall precautions and the need for 24 hour supervision once patient returns home. AVS reviewed and "My Chart" discussed with pt. Pt/family capable of verbalizing medications, dressing changes, signs and symptoms of infection, and follow-up appointments. Remains hemodynamically stable. No signs and symptoms of distress. Educated pt to return to ER in the case of SOB, dizziness, or chest pain.

## 2016-09-07 NOTE — Progress Notes (Signed)
Physical Therapy Treatment Patient Details Name: Charles George MRN: [id number] DOB: [birth year] Today's Date: 09/07/2016    History of Present Illness Pt s/p R THR and with hx of L TKR (16).      PT Comments    Pt continues to progress with improving stability, strength and endurance but also impulsive.  Pts nephew present and feels comfortable with assisting pt at current level of assist.   Follow Up Recommendations  Home health PT;SNF     Equipment Recommendations  None recommended by PT    Recommendations for Other Services OT consult     Precautions / Restrictions Precautions Precautions: Fall Precaution Comments: Confusion after surgery Restrictions Weight Bearing Restrictions: No Other Position/Activity Restrictions: WBAT    Mobility  Bed Mobility Overal bed mobility: Needs Assistance Bed Mobility: Supine to Sit     Supine to sit: Supervision     General bed mobility comments: pt in chair  Transfers Overall transfer level: Needs assistance Equipment used: Rolling walker (2 wheeled) Transfers: Sit to/from Stand Sit to Stand: Min guard         General transfer comment: cues for safe transition position and use of UEs to self assist  Ambulation/Gait Ambulation/Gait assistance: Min guard Ambulation Distance (Feet): 100 Feet Assistive device: 4-wheeled walker Gait Pattern/deviations: Step-to pattern;Narrow base of support;Antalgic;Decreased step length - right;Decreased step length - left;Shuffle Gait velocity: decr Gait velocity interpretation: Below normal speed for age/gender General Gait Details: cues for sequence, posture and position from RW   Stairs Stairs: Yes   Stair Management: One rail Left;Step to pattern;Forwards;With cane Number of Stairs: 2 General stair comments: cues for sequence and foot/cane placement  Wheelchair Mobility    Modified Rankin (Stroke Patients Only)       Balance Overall balance assessment: Needs  assistance Sitting-balance support: No upper extremity supported;Feet supported Sitting balance-Leahy Scale: Good     Standing balance support: Bilateral upper extremity supported Standing balance-Leahy Scale: Fair                      Cognition Arousal/Alertness: Awake/alert Behavior During Therapy: Impulsive Overall Cognitive Status: Within Functional Limits for tasks assessed Area of Impairment: Safety/judgement Orientation Level: Time;Place;Situation       Safety/Judgement: Decreased awareness of safety     General Comments: educated pt and nephew at length in fall prevention, importance of supervision with mobility    Exercises Total Joint Exercises Ankle Circles/Pumps: AROM;Both;20 reps;Supine Quad Sets: AROM;Both;10 reps;Supine Heel Slides: AAROM;Right;20 reps;Supine Hip ABduction/ADduction: AAROM;Right;15 reps;Supine    General Comments        Pertinent Vitals/Pain Pain Assessment: Faces Faces Pain Scale: Hurts a little bit Pain Location: R hip with sit to stand Pain Descriptors / Indicators: Sore Pain Intervention(s): Limited activity within patient's tolerance;Monitored during session;Premedicated before session;Ice applied    Home Living                      Prior Function            PT Goals (current goals can now be found in the care plan section) Acute Rehab PT Goals Patient Stated Goal: Walk better than before surgery PT Goal Formulation: With patient Time For Goal Achievement: 09/09/16 Potential to Achieve Goals: Fair Progress towards PT goals: Progressing toward goals    Frequency    7X/week      PT Plan Current plan remains appropriate    Co-evaluation  End of Session Equipment Utilized During Treatment: Gait belt Activity Tolerance: Patient tolerated treatment well Patient left: in chair;with call bell/phone within reach;with family/visitor present Nurse Communication: Mobility status PT Visit  Diagnosis: Difficulty in walking, not elsewhere classified (R26.2)     Time: 1342-1410 PT Time Calculation (min) (ACUTE ONLY): 28 min  Charges:  $Gait Training: 8-22 mins $Therapeutic Exercise: 8-22 mins $Therapeutic Activity: 8-22 mins                    G Codes:       Charles George 09-12-2016, 3:38 PM

## 2016-09-07 NOTE — Progress Notes (Signed)
Physical Therapy Treatment Patient Details Name: Charles George MRN: [id number] DOB: 1933/04/16 Today's Date: 09/07/2016    History of Present Illness Pt s/p R THR and with hx of L TKR (16).      PT Comments    Pt very motivated and demonstrating improvement in balance this date but continues impulsive and with questionable safety awareness and limited endurance.   Follow Up Recommendations  Home health PT;SNF     Equipment Recommendations  None recommended by PT    Recommendations for Other Services OT consult     Precautions / Restrictions Precautions Precautions: Fall Precaution Comments: Confusion after surgery Restrictions Weight Bearing Restrictions: No Other Position/Activity Restrictions: WBAT    Mobility  Bed Mobility Overal bed mobility: Needs Assistance Bed Mobility: Supine to Sit     Supine to sit: Supervision     General bed mobility comments: cues for safety  Transfers Overall transfer level: Needs assistance Equipment used: Rolling walker (2 wheeled) Transfers: Sit to/from Stand Sit to Stand: Min guard         General transfer comment: cues for safe transition position and use of UEs to self assist  Ambulation/Gait Ambulation/Gait assistance: Min assist;Min guard Ambulation Distance (Feet): 120 Feet Assistive device: Rolling walker (2 wheeled) Gait Pattern/deviations: Step-to pattern;Narrow base of support;Antalgic;Decreased step length - right;Decreased step length - left;Shuffle;Leaning posteriorly Gait velocity: decr Gait velocity interpretation: Below normal speed for age/gender General Gait Details: cues for sequence, posture and position from RW   Stairs Stairs: Yes   Stair Management: Two rails;One rail Right;Step to pattern;Forwards;With cane Number of Stairs: 5 General stair comments: 2 stairs with bil rails and 3 stairs with single rail and cane.  Cues for sequence and foot/cane placement  Wheelchair Mobility     Modified Rankin (Stroke Patients Only)       Balance   Sitting-balance support: No upper extremity supported;Feet supported Sitting balance-Leahy Scale: Good     Standing balance support: Bilateral upper extremity supported Standing balance-Leahy Scale: Poor                      Cognition Arousal/Alertness: Awake/alert Behavior During Therapy: WFL for tasks assessed/performed Overall Cognitive Status: Within Functional Limits for tasks assessed Area of Impairment: Orientation;Memory Orientation Level: Time;Place;Situation                  Exercises Total Joint Exercises Ankle Circles/Pumps: AROM;Both;20 reps;Supine Quad Sets: AROM;Both;10 reps;Supine Heel Slides: AAROM;Right;20 reps;Supine Hip ABduction/ADduction: AAROM;Right;15 reps;Supine    General Comments        Pertinent Vitals/Pain Pain Assessment: Faces Faces Pain Scale: Hurts a little bit Pain Location: R hip during transfers Pain Descriptors / Indicators: Sore Pain Intervention(s): Limited activity within patient's tolerance;Monitored during session;Premedicated before session;Ice applied    Home Living                      Prior Function            PT Goals (current goals can now be found in the care plan section) Acute Rehab PT Goals Patient Stated Goal: Walk better than before surgery PT Goal Formulation: With patient Time For Goal Achievement: 09/09/16 Potential to Achieve Goals: Fair Progress towards PT goals: Progressing toward goals    Frequency    7X/week      PT Plan Current plan remains appropriate    Co-evaluation             End of Session  Equipment Utilized During Treatment: Gait belt Activity Tolerance: Patient tolerated treatment well Patient left: in chair;with call bell/phone within reach;with family/visitor present Nurse Communication: Mobility status PT Visit Diagnosis: Difficulty in walking, not elsewhere classified (R26.2)      Time: 1004-1040 PT Time Calculation (min) (ACUTE ONLY): 36 min  Charges:  $Gait Training: 8-22 mins $Therapeutic Exercise: 8-22 mins                    G Codes:       Charles George Sep 21, 2016, 12:24 PM

## 2016-09-07 NOTE — Progress Notes (Signed)
Subjective: 3 Days Post-Op Procedure(s) (LRB): RIGHT TOTAL HIP ARTHROPLASTY ANTERIOR APPROACH  WITH ACETABULUM GRAFTING (Right) Patient reports pain as 2 on 0-10 scale.    Objective: Vital signs in last 24 hours: Temp:  [97.5 F (36.4 C)-98.6 F (37 C)] 98.6 F (37 C) (03/17 0600) Pulse Rate:  [93-107] 97 (03/17 0600) Resp:  [16-20] 16 (03/17 0600) BP: (114-131)/(45-68) 120/68 (03/17 0600) SpO2:  [96 %-98 %] 98 % (03/17 0600)  Intake/Output from previous day: 03/16 0701 - 03/17 0700 In: 720 [P.O.:720] Out: 2200 [Urine:2200] Intake/Output this shift: No intake/output data recorded.   Recent Labs  09/05/16 0422 09/06/16 0423 09/07/16 0746  HGB 11.3* 10.5* 10.2*    Recent Labs  09/06/16 0423 09/07/16 0746  WBC 17.5* 12.1*  RBC 3.50* 3.42*  HCT 31.4* 30.7*  PLT 198 182    Recent Labs  09/05/16 0422 09/06/16 0423  NA 139 138  K 4.3 4.1  CL 105 104  CO2 26 26  BUN 20 26*  CREATININE 1.21 1.22  GLUCOSE 133* 110*  CALCIUM 9.2 9.3   No results for input(s): LABPT, INR in the last 72 hours.  Neurologically intact Sensation intact distally Incision: dressing C/D/I  Assessment/Plan: 3 Days Post-Op Procedure(s) (LRB): RIGHT TOTAL HIP ARTHROPLASTY ANTERIOR APPROACH  WITH ACETABULUM GRAFTING (Right) Up with therapy  D/C  Charles George C 09/07/2016, 9:05 AM

## 2016-09-09 DIAGNOSIS — E785 Hyperlipidemia, unspecified: Secondary | ICD-10-CM | POA: Diagnosis not present

## 2016-09-09 DIAGNOSIS — M1990 Unspecified osteoarthritis, unspecified site: Secondary | ICD-10-CM | POA: Diagnosis not present

## 2016-09-09 DIAGNOSIS — I119 Hypertensive heart disease without heart failure: Secondary | ICD-10-CM | POA: Diagnosis not present

## 2016-09-09 DIAGNOSIS — J45909 Unspecified asthma, uncomplicated: Secondary | ICD-10-CM | POA: Diagnosis not present

## 2016-09-09 DIAGNOSIS — Z471 Aftercare following joint replacement surgery: Secondary | ICD-10-CM | POA: Diagnosis not present

## 2016-09-09 DIAGNOSIS — N400 Enlarged prostate without lower urinary tract symptoms: Secondary | ICD-10-CM | POA: Diagnosis not present

## 2016-09-09 DIAGNOSIS — I2510 Atherosclerotic heart disease of native coronary artery without angina pectoris: Secondary | ICD-10-CM | POA: Diagnosis not present

## 2016-09-09 DIAGNOSIS — K219 Gastro-esophageal reflux disease without esophagitis: Secondary | ICD-10-CM | POA: Diagnosis not present

## 2016-09-09 DIAGNOSIS — Z96652 Presence of left artificial knee joint: Secondary | ICD-10-CM | POA: Diagnosis not present

## 2016-09-09 DIAGNOSIS — Z7901 Long term (current) use of anticoagulants: Secondary | ICD-10-CM | POA: Diagnosis not present

## 2016-09-09 DIAGNOSIS — I350 Nonrheumatic aortic (valve) stenosis: Secondary | ICD-10-CM | POA: Diagnosis not present

## 2016-09-09 DIAGNOSIS — Z96641 Presence of right artificial hip joint: Secondary | ICD-10-CM | POA: Diagnosis not present

## 2016-09-09 DIAGNOSIS — M353 Polymyalgia rheumatica: Secondary | ICD-10-CM | POA: Diagnosis not present

## 2016-09-09 LAB — AEROBIC/ANAEROBIC CULTURE (SURGICAL/DEEP WOUND)

## 2016-09-09 LAB — AEROBIC/ANAEROBIC CULTURE W GRAM STAIN (SURGICAL/DEEP WOUND): Culture: NO GROWTH

## 2016-09-18 DIAGNOSIS — I2510 Atherosclerotic heart disease of native coronary artery without angina pectoris: Secondary | ICD-10-CM | POA: Diagnosis not present

## 2016-09-18 DIAGNOSIS — Z471 Aftercare following joint replacement surgery: Secondary | ICD-10-CM | POA: Diagnosis not present

## 2016-09-18 DIAGNOSIS — I350 Nonrheumatic aortic (valve) stenosis: Secondary | ICD-10-CM | POA: Diagnosis not present

## 2016-09-18 DIAGNOSIS — E785 Hyperlipidemia, unspecified: Secondary | ICD-10-CM | POA: Diagnosis not present

## 2016-09-18 DIAGNOSIS — J45909 Unspecified asthma, uncomplicated: Secondary | ICD-10-CM | POA: Diagnosis not present

## 2016-09-18 DIAGNOSIS — M353 Polymyalgia rheumatica: Secondary | ICD-10-CM | POA: Diagnosis not present

## 2016-09-18 DIAGNOSIS — K219 Gastro-esophageal reflux disease without esophagitis: Secondary | ICD-10-CM | POA: Diagnosis not present

## 2016-09-18 DIAGNOSIS — M1990 Unspecified osteoarthritis, unspecified site: Secondary | ICD-10-CM | POA: Diagnosis not present

## 2016-09-18 DIAGNOSIS — Z7901 Long term (current) use of anticoagulants: Secondary | ICD-10-CM | POA: Diagnosis not present

## 2016-09-18 DIAGNOSIS — N400 Enlarged prostate without lower urinary tract symptoms: Secondary | ICD-10-CM | POA: Diagnosis not present

## 2016-09-18 DIAGNOSIS — Z96641 Presence of right artificial hip joint: Secondary | ICD-10-CM | POA: Diagnosis not present

## 2016-09-18 DIAGNOSIS — Z96652 Presence of left artificial knee joint: Secondary | ICD-10-CM | POA: Diagnosis not present

## 2016-09-18 DIAGNOSIS — I119 Hypertensive heart disease without heart failure: Secondary | ICD-10-CM | POA: Diagnosis not present

## 2016-09-20 NOTE — Discharge Summary (Signed)
Physician Discharge Summary   Patient ID: Charles George MRN: [id number] DOB/AGE: 1933-02-15 83 y.o.  Admit date: 09/04/2016 Discharge date: 09/07/2016  Primary Diagnosis:  Osteoarthritis of the Right hip.  Admission Diagnoses:  Past Medical History:  Diagnosis Date  . Arthritis   . Asthma   . Bladder stone   . BPH (benign prostatic hypertrophy)   . Cancer ([place])    melanoma on right arm   . Complication of anesthesia    10/19/2014 following knee surgery pt states did not awaken for a day and half  . Coronary artery disease   . Falls   . Foley catheter in place    hx of  . GERD (gastroesophageal reflux disease)   . Heart murmur   . History of chicken pox   . History of kidney stones   . History of radiation therapy   . Hypertension    no meds  . Pneumonia    09/2014   . Shortness of breath dyspnea    increased exertion   . Tingling    hands bilat   . Urinary tract infection    hx of    Discharge Diagnoses:   Principal Problem:   OA (osteoarthritis) of hip  Estimated body mass index is 19.39 kg/m as calculated from the following:   Height as of this encounter: 6' 2"  (1.88 m).   Weight as of this encounter: 68.5 kg (151 lb).  Procedure(s) (LRB): RIGHT TOTAL HIP ARTHROPLASTY ANTERIOR APPROACH  WITH ACETABULUM GRAFTING (Right)   Consults: None  HPI: Charles George is a 83 y.o. male who has advanced end-  stage arthritis of their Right  hip with progressively worsening pain and  dysfunction.The patient has failed nonoperative management and presents for  total hip arthroplasty. He has a rapidly progressive osteoarthritis with near complete erosion of his femoral head.  Laboratory Data: Admission on 09/04/2016, Discharged on 09/07/2016  Component Date Value Ref Range Status  . Specimen Description 09/04/2016 HIP RIGHT   Final  . Special Requests 09/04/2016 NONE   Final  . Gram Stain 09/04/2016    Final                   Value:RARE WBC PRESENT, PREDOMINANTLY  PMN NO ORGANISMS SEEN Gram Stain Report Called to,Read Back By and Verified With: SHYSHKO, K. RN @1319  ON 3.14.18 BY NMCCOY   . Culture 09/04/2016    Final                   Value:No growth aerobically or anaerobically. Performed at [facility], [place] [address], [place], [place] [address]   . Report Status 09/04/2016 09/09/2016 FINAL   Final  . WBC 09/05/2016 17.7* 4.0 - 10.5 K/uL Final  . RBC 09/05/2016 3.88* 4.22 - 5.81 MIL/uL Final  . Hemoglobin 09/05/2016 11.3* 13.0 - 17.0 g/dL Final  . HCT 09/05/2016 34.8* 39.0 - 52.0 % Final  . MCV 09/05/2016 89.7  78.0 - 100.0 fL Final  . MCH 09/05/2016 29.1  26.0 - 34.0 pg Final  . MCHC 09/05/2016 32.5  30.0 - 36.0 g/dL Final  . RDW 09/05/2016 14.0  11.5 - 15.5 % Final  . Platelets 09/05/2016 224  150 - 400 K/uL Final  . Sodium 09/05/2016 139  135 - 145 mmol/L Final  . Potassium 09/05/2016 4.3  3.5 - 5.1 mmol/L Final  . Chloride 09/05/2016 105  101 - 111 mmol/L Final  . CO2 09/05/2016 26  22 -  32 mmol/L Final  . Glucose, Bld 09/05/2016 133* 65 - 99 mg/dL Final  . BUN 09/05/2016 20  6 - 20 mg/dL Final  . Creatinine, Ser 09/05/2016 1.21  0.61 - 1.24 mg/dL Final  . Calcium 09/05/2016 9.2  8.9 - 10.3 mg/dL Final  . GFR calc non Af Amer 09/05/2016 54* >60 mL/min Final  . GFR calc Af Amer 09/05/2016 >60  >60 mL/min Final   Comment: (NOTE) The eGFR has been calculated using the CKD EPI equation. This calculation has not been validated in all clinical situations. eGFR's persistently <60 mL/min signify possible Chronic Kidney Disease.   . Anion gap 09/05/2016 8  5 - 15 Final  . WBC 09/06/2016 17.5* 4.0 - 10.5 K/uL Final  . RBC 09/06/2016 3.50* 4.22 - 5.81 MIL/uL Final  . Hemoglobin 09/06/2016 10.5* 13.0 - 17.0 g/dL Final  . HCT 09/06/2016 31.4* 39.0 - 52.0 % Final  . MCV 09/06/2016 89.7  78.0 - 100.0 fL Final  . MCH 09/06/2016 30.0  26.0 - 34.0 pg Final  . MCHC 09/06/2016 33.4  30.0 - 36.0 g/dL Final  . RDW 09/06/2016 14.3  11.5 - 15.5  % Final  . Platelets 09/06/2016 198  150 - 400 K/uL Final  . Sodium 09/06/2016 138  135 - 145 mmol/L Final  . Potassium 09/06/2016 4.1  3.5 - 5.1 mmol/L Final  . Chloride 09/06/2016 104  101 - 111 mmol/L Final  . CO2 09/06/2016 26  22 - 32 mmol/L Final  . Glucose, Bld 09/06/2016 110* 65 - 99 mg/dL Final  . BUN 09/06/2016 26* 6 - 20 mg/dL Final  . Creatinine, Ser 09/06/2016 1.22  0.61 - 1.24 mg/dL Final  . Calcium 09/06/2016 9.3  8.9 - 10.3 mg/dL Final  . GFR calc non Af Amer 09/06/2016 54* >60 mL/min Final  . GFR calc Af Amer 09/06/2016 >60  >60 mL/min Final   Comment: (NOTE) The eGFR has been calculated using the CKD EPI equation. This calculation has not been validated in all clinical situations. eGFR's persistently <60 mL/min signify possible Chronic Kidney Disease.   . Anion gap 09/06/2016 8  5 - 15 Final  . WBC 09/07/2016 12.1* 4.0 - 10.5 K/uL Final  . RBC 09/07/2016 3.42* 4.22 - 5.81 MIL/uL Final  . Hemoglobin 09/07/2016 10.2* 13.0 - 17.0 g/dL Final  . HCT 09/07/2016 30.7* 39.0 - 52.0 % Final  . MCV 09/07/2016 89.8  78.0 - 100.0 fL Final  . MCH 09/07/2016 29.8  26.0 - 34.0 pg Final  . MCHC 09/07/2016 33.2  30.0 - 36.0 g/dL Final  . RDW 09/07/2016 14.8  11.5 - 15.5 % Final  . Platelets 09/07/2016 182  150 - 400 K/uL Final  Hospital Outpatient Visit on 08/27/2016  Component Date Value Ref Range Status  . aPTT 08/27/2016 29  24 - 36 seconds Final  . WBC 08/27/2016 9.2  4.0 - 10.5 K/uL Final  . RBC 08/27/2016 4.93  4.22 - 5.81 MIL/uL Final  . Hemoglobin 08/27/2016 14.6  13.0 - 17.0 g/dL Final  . HCT 08/27/2016 45.0  39.0 - 52.0 % Final  . MCV 08/27/2016 91.3  78.0 - 100.0 fL Final  . MCH 08/27/2016 29.6  26.0 - 34.0 pg Final  . MCHC 08/27/2016 32.4  30.0 - 36.0 g/dL Final  . RDW 08/27/2016 14.5  11.5 - 15.5 % Final  . Platelets 08/27/2016 202  150 - 400 K/uL Final  . Sodium 08/27/2016 139  135 - 145 mmol/L Final  .  Potassium 08/27/2016 4.0  3.5 - 5.1 mmol/L Final  .  Chloride 08/27/2016 105  101 - 111 mmol/L Final  . CO2 08/27/2016 27  22 - 32 mmol/L Final  . Glucose, Bld 08/27/2016 91  65 - 99 mg/dL Final  . BUN 08/27/2016 25* 6 - 20 mg/dL Final  . Creatinine, Ser 08/27/2016 1.39* 0.61 - 1.24 mg/dL Final  . Calcium 08/27/2016 9.5  8.9 - 10.3 mg/dL Final  . Total Protein 08/27/2016 7.5  6.5 - 8.1 g/dL Final  . Albumin 08/27/2016 4.1  3.5 - 5.0 g/dL Final  . AST 08/27/2016 21  15 - 41 U/L Final  . ALT 08/27/2016 14* 17 - 63 U/L Final  . Alkaline Phosphatase 08/27/2016 61  38 - 126 U/L Final  . Total Bilirubin 08/27/2016 0.7  0.3 - 1.2 mg/dL Final  . GFR calc non Af Amer 08/27/2016 46* >60 mL/min Final  . GFR calc Af Amer 08/27/2016 53* >60 mL/min Final   Comment: (NOTE) The eGFR has been calculated using the CKD EPI equation. This calculation has not been validated in all clinical situations. eGFR's persistently <60 mL/min signify possible Chronic Kidney Disease.   . Anion gap 08/27/2016 7  5 - 15 Final  . Prothrombin Time 08/27/2016 13.4  11.4 - 15.2 seconds Final  . INR 08/27/2016 1.02   Final  . ABO/RH(D) 08/27/2016 A NEG   Final  . Antibody Screen 08/27/2016 NEG   Final  . Sample Expiration 08/27/2016 09/07/2016   Final  . Extend sample reason 08/27/2016 NO TRANSFUSIONS OR PREGNANCY IN THE PAST 3 MONTHS   Final  . MRSA, PCR 08/27/2016 POSITIVE* NEGATIVE Final   Comment: RESULT CALLED TO, READ BACK BY AND VERIFIED WITH: AFTERHOURS   . Staphylococcus aureus 08/27/2016 POSITIVE* NEGATIVE Final   Comment:        The Xpert SA Assay (FDA approved for NASAL specimens in patients over 86 years of age), is one component of a comprehensive surveillance program.  Test performance has been validated by [facility] for patients greater than or equal to 72 year old. It is not intended to diagnose infection nor to guide or monitor treatment.   . ABO/RH(D) 08/27/2016 A NEG   Final     X-Rays:Dg Pelvis Portable  Result Date:  09/04/2016 CLINICAL DATA:  Right hip replacement. EXAM: PORTABLE PELVIS 1-2 VIEWS; DG C-ARM 61-120 MIN-NO REPORT COMPARISON:  None. FINDINGS: A right total hip arthroplasty is noted. The hip appears located. Gas is present in the joint following surgery. The pelvis is intact. IMPRESSION: Right total hip arthroplasty without radiographic evidence for complication. Electronically Signed   By: San Morelle M.D.   On: 09/04/2016 15:57   Dg C-arm 61-120 Min-no Report  Result Date: 09/04/2016 Fluoroscopy was utilized by the requesting physician.  No radiographic interpretation.    EKG: Orders placed or performed during the hospital encounter of 08/27/16  . EKG 12 lead  . EKG 12 lead     Hospital Course: Patient was admitted to [facility] and taken to the OR and underwent the above state procedure without complications.  Patient tolerated the procedure well and was later transferred to the recovery room and then to the orthopaedic floor for postoperative care.  They were given PO and IV analgesics for pain control following their surgery.  They were given 24 hours of postoperative antibiotics of  Anti-infectives    Start     Dose/Rate Route Frequency Ordered Stop   09/04/16 1800  ceFAZolin (ANCEF) IVPB 2g/100 mL premix     2 g 200 mL/hr over 30 Minutes Intravenous Every 6 hours 09/04/16 1644 09/05/16 0023   09/04/16 0915  ceFAZolin (ANCEF) IVPB 2g/100 mL premix     2 g 200 mL/hr over 30 Minutes Intravenous On call to O.R. 09/04/16 0900 09/04/16 1245   09/04/16 0900  vancomycin (VANCOCIN) IVPB 1000 mg/200 mL premix     1,000 mg 200 mL/hr over 60 Minutes Intravenous  Once 09/04/16 0859 09/04/16 1237     and started on DVT prophylaxis in the form of Xarelto.   PT and OT were ordered for total hip protocol.  The patient was allowed to be WBAT with therapy. Discharge planning was consulted to help with postop disposition and equipment needs.  Patient had a tough night on the evening  of surgery due to confusion.  Stopped all narcotics.  They started to get up OOB with therapy on day one.  Hemovac drain was pulled without difficulty.  Continued to work with therapy into day two.  Dressing was changed on day two and the incision was healing well.  By day three, the patient had progressed with therapy, meeting their goals, and the confusion had improved.  Incision was healing well.  Patient was seen in rounds and was ready to go home.  Diet: Cardiac diet Activity:WBAT Follow-up:in 2 weeks Disposition - Home Discharged Condition: good   Discharge Instructions    Call MD / Call 911    Complete by:  As directed    If you experience chest pain or shortness of breath, CALL 911 and be transported to the hospital emergency room.  If you develope a fever above 101 F, pus (white drainage) or increased drainage or redness at the wound, or calf pain, call your surgeon's office.   Call MD / Call 911    Complete by:  As directed    If you experience chest pain or shortness of breath, CALL 911 and be transported to the hospital emergency room.  If you develope a fever above 101 F, pus (white drainage) or increased drainage or redness at the wound, or calf pain, call your surgeon's office.   Change dressing    Complete by:  As directed    You may change your dressing dressing daily with sterile 4 x 4 inch gauze dressing and paper tape.  Do not submerge the incision under water.   Constipation Prevention    Complete by:  As directed    Drink plenty of fluids.  Prune juice may be helpful.  You may use a stool softener, such as Colace (over the counter) 100 mg twice a day.  Use MiraLax (over the counter) for constipation as needed.   Constipation Prevention    Complete by:  As directed    Drink plenty of fluids.  Prune juice may be helpful.  You may use a stool softener, such as Colace (over the counter) 100 mg twice a day.  Use MiraLax (over the counter) for constipation as needed.   Diet -  low sodium heart healthy    Complete by:  As directed    Diet - low sodium heart healthy    Complete by:  As directed    Discharge instructions    Complete by:  As directed    Pick up stool softner and laxative for home use following surgery while on pain medications. Do not submerge incision under water. Please use good hand washing techniques while  changing dressing each day. May shower starting three days after surgery. Please use a clean towel to pat the incision dry following showers. Continue to use ice for pain and swelling after surgery. Do not use any lotions or creams on the incision until instructed by your surgeon.  Wear both TED hose on both legs during the day every day for three weeks, but may have off at night at home.  Postoperative Constipation Protocol  Constipation - defined medically as fewer than three stools per week and severe constipation as less than one stool per week.  One of the most common issues patients have following surgery is constipation.  Even if you have a regular bowel pattern at home, your normal regimen is likely to be disrupted due to multiple reasons following surgery.  Combination of anesthesia, postoperative narcotics, change in appetite and fluid intake all can affect your bowels.  In order to avoid complications following surgery, here are some recommendations in order to help you during your recovery period.  Colace (docusate) - Pick up an over-the-counter form of Colace or another stool softener and take twice a day as long as you are requiring postoperative pain medications.  Take with a full glass of water daily.  If you experience loose stools or diarrhea, hold the colace until you stool forms back up.  If your symptoms do not get better within 1 week or if they get worse, check with your doctor.  Dulcolax (bisacodyl) - Pick up over-the-counter and take as directed by the product packaging as needed to assist with the movement of your  bowels.  Take with a full glass of water.  Use this product as needed if not relieved by Colace only.   MiraLax (polyethylene glycol) - Pick up over-the-counter to have on hand.  MiraLax is a solution that will increase the amount of water in your bowels to assist with bowel movements.  Take as directed and can mix with a glass of water, juice, soda, coffee, or tea.  Take if you go more than two days without a movement. Do not use MiraLax more than once per day. Call your doctor if you are still constipated or irregular after using this medication for 7 days in a row.  If you continue to have problems with postoperative constipation, please contact the office for further assistance and recommendations.  If you experience "the worst abdominal pain ever" or develop nausea or vomiting, please contact the office immediatly for further recommendations for treatment.   Take Xarelto for two and a half more weeks, then discontinue Xarelto. Once the patient has completed the Xarelto, they may resume the 81 mg Aspirin.   Do not sit on low chairs, stoools or toilet seats, as it may be difficult to get up from low surfaces    Complete by:  As directed    Driving restrictions    Complete by:  As directed    No driving until released by the physician.   Increase activity slowly as tolerated    Complete by:  As directed    Increase activity slowly as tolerated    Complete by:  As directed    Lifting restrictions    Complete by:  As directed    No lifting until released by the physician.   Patient may shower    Complete by:  As directed    You may shower without a dressing once there is no drainage.  Do not wash over the wound.  If drainage remains, do not shower until drainage stops.   TED hose    Complete by:  As directed    Use stockings (TED hose) for 3 weeks on both leg(s).  You may remove them at night for sleeping.   Weight bearing as tolerated    Complete by:  As directed    Laterality:  right    Extremity:  Lower     Allergies as of 09/07/2016      Reactions   Ciprofloxacin    Stomach pain      Medication List    STOP taking these medications   aspirin EC 81 MG tablet   ibuprofen 200 MG tablet Commonly known as:  ADVIL,MOTRIN   PROBIOTIC PO   Vitamin D (Ergocalciferol) 50000 units Caps capsule Commonly known as:  DRISDOL     TAKE these medications   acetaminophen 325 MG tablet Commonly known as:  TYLENOL Take 2 tablets (650 mg total) by mouth every 6 (six) hours as needed for mild pain (or Fever >/= 101).   COMBIVENT RESPIMAT 20-100 MCG/ACT Aers respimat Generic drug:  Ipratropium-Albuterol Inhale 1 puff into the lungs every 6 (six) hours as needed for wheezing or shortness of breath.   donepezil 5 MG tablet Commonly known as:  ARICEPT Take 5 mg by mouth daily with supper.   ENSURE PLUS Liqd Take 237 mLs by mouth daily.   rivaroxaban 10 MG Tabs tablet Commonly known as:  XARELTO Take 1 tablet (10 mg total) by mouth daily with breakfast. Take Xarelto for two and a half more weeks following discharge from the hospital, then discontinue Xarelto. Once the patient has completed the Xarelto, they may resume the 81 mg Aspirin.      Follow-up Information    [facility] Follow up.   Specialty:  [place] Why:  home health physical therapy Contact information: [address] [place] [address] [phone]           Signed: Arlee Muslim, PA-C Orthopaedic Surgery 09/20/2016, 10:49 AM

## 2016-09-24 DIAGNOSIS — Z471 Aftercare following joint replacement surgery: Secondary | ICD-10-CM | POA: Diagnosis not present

## 2016-09-24 DIAGNOSIS — M353 Polymyalgia rheumatica: Secondary | ICD-10-CM | POA: Diagnosis not present

## 2016-09-24 DIAGNOSIS — Z7901 Long term (current) use of anticoagulants: Secondary | ICD-10-CM | POA: Diagnosis not present

## 2016-09-24 DIAGNOSIS — I2510 Atherosclerotic heart disease of native coronary artery without angina pectoris: Secondary | ICD-10-CM | POA: Diagnosis not present

## 2016-09-24 DIAGNOSIS — E785 Hyperlipidemia, unspecified: Secondary | ICD-10-CM | POA: Diagnosis not present

## 2016-09-24 DIAGNOSIS — Z96652 Presence of left artificial knee joint: Secondary | ICD-10-CM | POA: Diagnosis not present

## 2016-09-24 DIAGNOSIS — I350 Nonrheumatic aortic (valve) stenosis: Secondary | ICD-10-CM | POA: Diagnosis not present

## 2016-09-24 DIAGNOSIS — M1990 Unspecified osteoarthritis, unspecified site: Secondary | ICD-10-CM | POA: Diagnosis not present

## 2016-09-24 DIAGNOSIS — J45909 Unspecified asthma, uncomplicated: Secondary | ICD-10-CM | POA: Diagnosis not present

## 2016-09-24 DIAGNOSIS — K219 Gastro-esophageal reflux disease without esophagitis: Secondary | ICD-10-CM | POA: Diagnosis not present

## 2016-09-24 DIAGNOSIS — N400 Enlarged prostate without lower urinary tract symptoms: Secondary | ICD-10-CM | POA: Diagnosis not present

## 2016-09-24 DIAGNOSIS — I119 Hypertensive heart disease without heart failure: Secondary | ICD-10-CM | POA: Diagnosis not present

## 2016-09-24 DIAGNOSIS — Z96641 Presence of right artificial hip joint: Secondary | ICD-10-CM | POA: Diagnosis not present

## 2016-10-01 DIAGNOSIS — I119 Hypertensive heart disease without heart failure: Secondary | ICD-10-CM | POA: Diagnosis not present

## 2016-10-01 DIAGNOSIS — M1990 Unspecified osteoarthritis, unspecified site: Secondary | ICD-10-CM | POA: Diagnosis not present

## 2016-10-01 DIAGNOSIS — M353 Polymyalgia rheumatica: Secondary | ICD-10-CM | POA: Diagnosis not present

## 2016-10-01 DIAGNOSIS — Z96652 Presence of left artificial knee joint: Secondary | ICD-10-CM | POA: Diagnosis not present

## 2016-10-01 DIAGNOSIS — N400 Enlarged prostate without lower urinary tract symptoms: Secondary | ICD-10-CM | POA: Diagnosis not present

## 2016-10-01 DIAGNOSIS — I350 Nonrheumatic aortic (valve) stenosis: Secondary | ICD-10-CM | POA: Diagnosis not present

## 2016-10-01 DIAGNOSIS — Z96641 Presence of right artificial hip joint: Secondary | ICD-10-CM | POA: Diagnosis not present

## 2016-10-01 DIAGNOSIS — Z471 Aftercare following joint replacement surgery: Secondary | ICD-10-CM | POA: Diagnosis not present

## 2016-10-01 DIAGNOSIS — E785 Hyperlipidemia, unspecified: Secondary | ICD-10-CM | POA: Diagnosis not present

## 2016-10-01 DIAGNOSIS — K219 Gastro-esophageal reflux disease without esophagitis: Secondary | ICD-10-CM | POA: Diagnosis not present

## 2016-10-01 DIAGNOSIS — I2510 Atherosclerotic heart disease of native coronary artery without angina pectoris: Secondary | ICD-10-CM | POA: Diagnosis not present

## 2016-10-01 DIAGNOSIS — Z7901 Long term (current) use of anticoagulants: Secondary | ICD-10-CM | POA: Diagnosis not present

## 2016-10-01 DIAGNOSIS — J45909 Unspecified asthma, uncomplicated: Secondary | ICD-10-CM | POA: Diagnosis not present

## 2016-10-08 DIAGNOSIS — Z471 Aftercare following joint replacement surgery: Secondary | ICD-10-CM | POA: Diagnosis not present

## 2016-10-08 DIAGNOSIS — Z96641 Presence of right artificial hip joint: Secondary | ICD-10-CM | POA: Diagnosis not present

## 2017-04-01 DIAGNOSIS — I359 Nonrheumatic aortic valve disorder, unspecified: Secondary | ICD-10-CM | POA: Diagnosis not present

## 2017-04-01 DIAGNOSIS — J449 Chronic obstructive pulmonary disease, unspecified: Secondary | ICD-10-CM | POA: Diagnosis not present

## 2017-04-01 DIAGNOSIS — Z79899 Other long term (current) drug therapy: Secondary | ICD-10-CM | POA: Diagnosis not present

## 2017-04-01 DIAGNOSIS — Z6820 Body mass index (BMI) 20.0-20.9, adult: Secondary | ICD-10-CM | POA: Diagnosis not present

## 2017-04-01 DIAGNOSIS — F0390 Unspecified dementia without behavioral disturbance: Secondary | ICD-10-CM | POA: Diagnosis not present

## 2017-04-01 DIAGNOSIS — Z23 Encounter for immunization: Secondary | ICD-10-CM | POA: Diagnosis not present

## 2017-04-01 DIAGNOSIS — E559 Vitamin D deficiency, unspecified: Secondary | ICD-10-CM | POA: Diagnosis not present

## 2017-09-26 IMAGING — DX DG PORTABLE PELVIS
1 series · 1 of 1 positions shown · non-contrast
Comparison: None.

CLINICAL DATA: Right hip replacement.

EXAM:
PORTABLE PELVIS 1-2 VIEWS; DG C-ARM 61-120 MIN-NO REPORT

[pelvis ap]
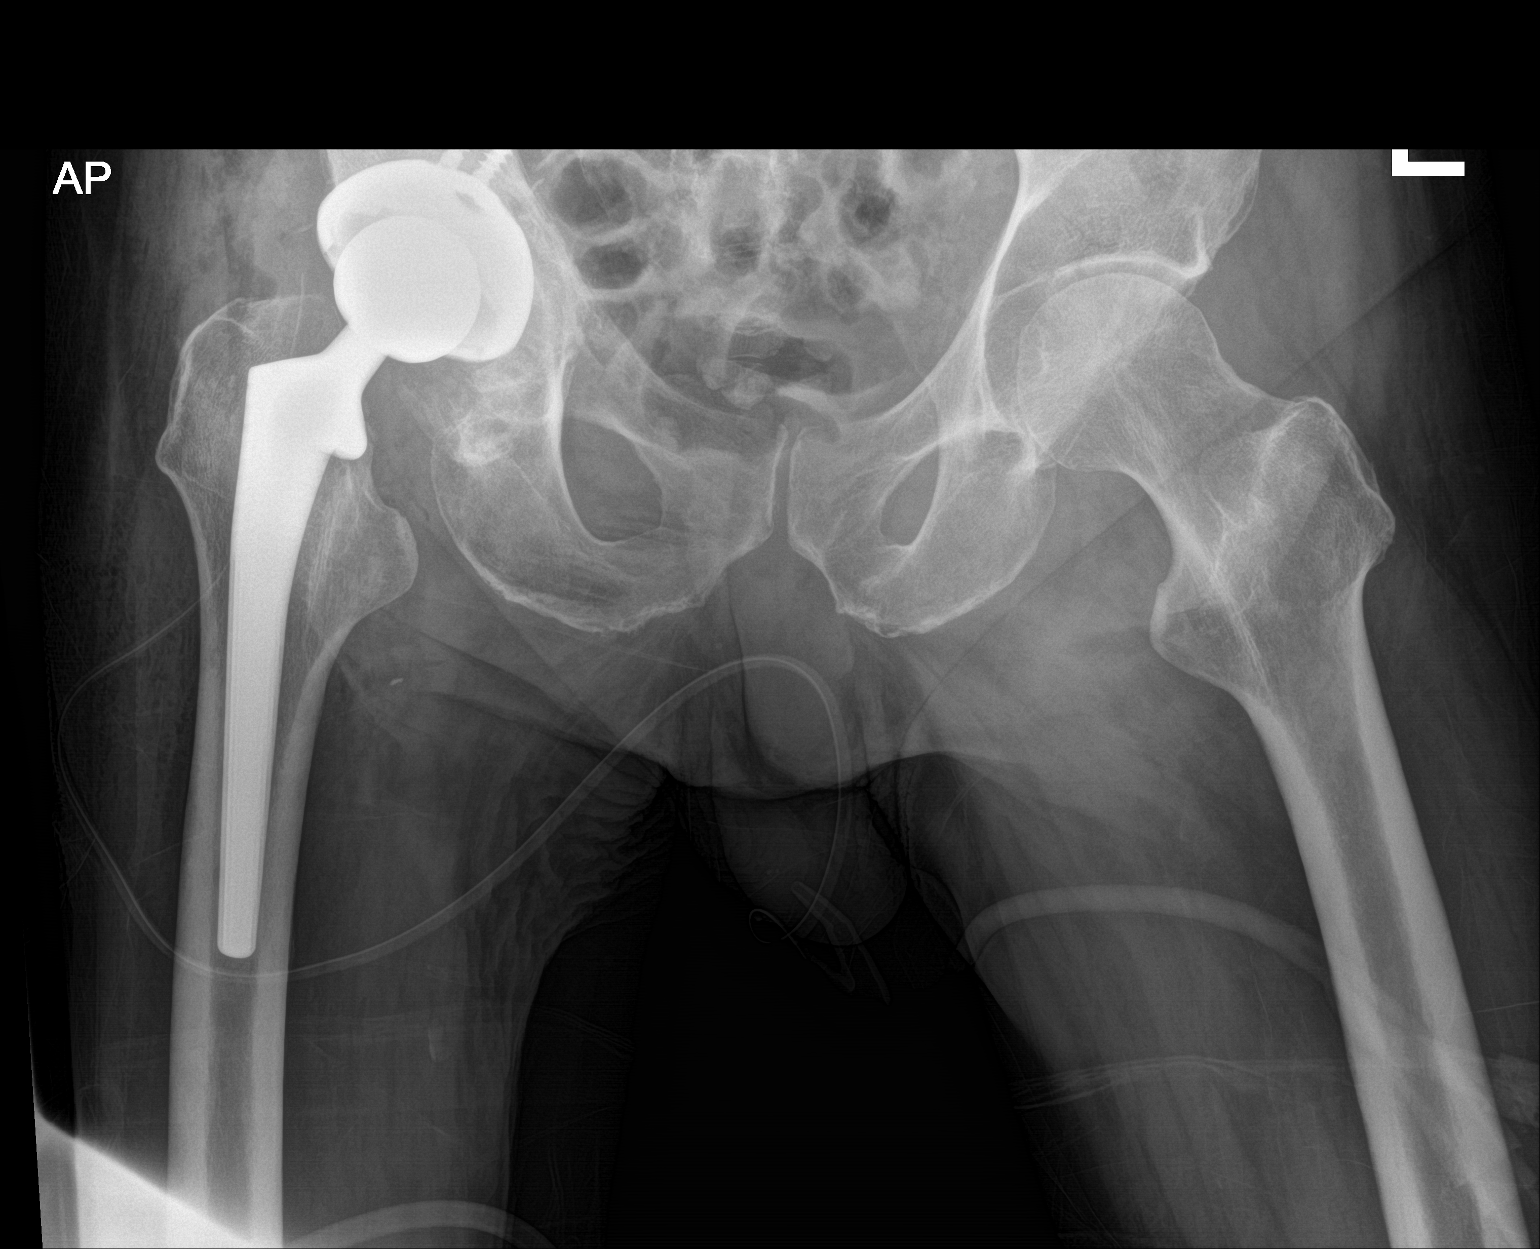

[1 of 1 positions shown; findings below may reference images not displayed]

FINDINGS: A right total hip arthroplasty is noted. The hip appears located.
Gas is present in the joint following surgery. The pelvis is intact.
IMPRESSION: Right total hip arthroplasty without radiographic evidence for
complication.

## 2017-09-30 DIAGNOSIS — F0390 Unspecified dementia without behavioral disturbance: Secondary | ICD-10-CM | POA: Diagnosis not present

## 2017-09-30 DIAGNOSIS — Z9181 History of falling: Secondary | ICD-10-CM | POA: Diagnosis not present

## 2017-09-30 DIAGNOSIS — Z1331 Encounter for screening for depression: Secondary | ICD-10-CM | POA: Diagnosis not present

## 2017-09-30 DIAGNOSIS — Z681 Body mass index (BMI) 19 or less, adult: Secondary | ICD-10-CM | POA: Diagnosis not present

## 2017-09-30 DIAGNOSIS — I10 Essential (primary) hypertension: Secondary | ICD-10-CM | POA: Diagnosis not present

## 2017-09-30 DIAGNOSIS — N401 Enlarged prostate with lower urinary tract symptoms: Secondary | ICD-10-CM | POA: Diagnosis not present

## 2017-09-30 DIAGNOSIS — Z79899 Other long term (current) drug therapy: Secondary | ICD-10-CM | POA: Diagnosis not present

## 2017-09-30 DIAGNOSIS — E559 Vitamin D deficiency, unspecified: Secondary | ICD-10-CM | POA: Diagnosis not present

## 2017-11-19 DIAGNOSIS — J189 Pneumonia, unspecified organism: Secondary | ICD-10-CM | POA: Diagnosis not present

## 2017-11-19 DIAGNOSIS — N401 Enlarged prostate with lower urinary tract symptoms: Secondary | ICD-10-CM | POA: Diagnosis not present

## 2017-11-19 DIAGNOSIS — R300 Dysuria: Secondary | ICD-10-CM | POA: Diagnosis not present

## 2017-11-19 DIAGNOSIS — J449 Chronic obstructive pulmonary disease, unspecified: Secondary | ICD-10-CM | POA: Diagnosis not present

## 2017-11-19 DIAGNOSIS — N390 Urinary tract infection, site not specified: Secondary | ICD-10-CM | POA: Diagnosis not present

## 2017-11-19 DIAGNOSIS — Z681 Body mass index (BMI) 19 or less, adult: Secondary | ICD-10-CM | POA: Diagnosis not present

## 2018-02-18 ENCOUNTER — Other Ambulatory Visit: Payer: Self-pay

## 2018-02-18 ENCOUNTER — Ambulatory Visit: Payer: PPO | Admitting: Sports Medicine

## 2018-02-18 ENCOUNTER — Encounter: Payer: Self-pay | Admitting: Sports Medicine

## 2018-02-18 VITALS — Resp 15 | Ht 74.0 in | Wt 155.0 lb

## 2018-02-18 DIAGNOSIS — B351 Tinea unguium: Secondary | ICD-10-CM

## 2018-02-18 DIAGNOSIS — I739 Peripheral vascular disease, unspecified: Secondary | ICD-10-CM

## 2018-02-18 DIAGNOSIS — L600 Ingrowing nail: Secondary | ICD-10-CM

## 2018-02-18 DIAGNOSIS — M79609 Pain in unspecified limb: Secondary | ICD-10-CM

## 2018-02-18 NOTE — Progress Notes (Signed)
Subjective: Charles George is a 84 y.o. male patient seen today in office with complaint of mildly painful thickened and elongated toenails; unable to trim.  Reports that he has pain at the left hallux medial border after he tried to trim out his own ingrown toenail admits that there has been some bloody discharge that he cleaned and treated with alcohol however there is still soreness.  Patient denies history of Diabetes, Neuropathy, or known vascular disease.  Patient denies any other constitutional symptoms at this time.  Patient has no other pedal complaints at this time.   Review of Systems  Musculoskeletal: Positive for joint pain.  All other systems reviewed and are negative.    Patient Active Problem List   Diagnosis Date Noted  . OA (osteoarthritis) of hip 09/04/2016  . BPH (benign prostatic hyperplasia) 03/01/2015  . Aortic stenosis 02/20/2015  . CAD (coronary artery disease), native coronary artery 02/20/2015  . Essential hypertension 02/20/2015  . Preoperative cardiovascular examination 02/20/2015    Current Outpatient Medications on File Prior to Visit  Medication Sig Dispense Refill  . donepezil (ARICEPT) 5 MG tablet Take 5 mg by mouth daily with supper.     . Ipratropium-Albuterol (COMBIVENT RESPIMAT) 20-100 MCG/ACT AERS respimat Inhale 1 puff into the lungs every 6 (six) hours as needed for wheezing or shortness of breath.     No current facility-administered medications on file prior to visit.     Allergies  Allergen Reactions  . Ciprofloxacin     Stomach pain    Objective: Physical Exam  General: Well developed, nourished, no acute distress, awake, alert and oriented x 3  Vascular: Dorsalis pedis artery 1/4 bilateral, Posterior tibial artery 0/4 bilateral, skin temperature warm to warm proximal to distal bilateral lower extremities, ++ varicosities, no pedal hair present bilateral.  Neurological: Gross sensation present via light touch bilateral.    Dermatological: Skin is warm, dry, and supple bilateral, Nails 1-10 are tender, long, thick, and discolored with mild subungal debris, there is mild incurvation at the medial borders of both hallux nails however at the left great toenail there is dried blood from patient trying to trim out his own ingrown toenail, no webspace macerations present bilateral, no open lesions present bilateral, no callus/corns/hyperkeratotic tissue present bilateral. No signs of infection bilateral.  Musculoskeletal: Asymptomatic bunion and hammertoe boney deformities noted bilateral. Muscular strength within normal limits without painon range of motion for patient status. No pain with calf compression bilateral.  Assessment and Plan:  Problem List Items Addressed This Visit    None    Visit Diagnoses    Pain due to onychomycosis of nail    -  Primary   Ingrowing nail       PVD (peripheral vascular disease) ([place])          -Examined patient.  -Discussed treatment options for painful mycotic nails with history of ingrowing. -Mechanically debrided and reduced mycotic nails and remove any offending margins with sterile nail nipper and dremel nail file without incident. -Advised patient to refrain from trying to trim out his ingrown toenails himself -Advised patient for 1 week to apply antibiotic cream and Band-Aid to bleeding corner at the left great toe and to closely monitor for any signs of infection if noted to return to office or report to ER immediately -Patient to return as needed for follow up evaluation or sooner if symptoms worsen.  Landis Martins, DPM

## 2018-02-18 NOTE — Progress Notes (Signed)
   Subjective:    Patient ID: Charles George, male    DOB: 1932/09/08, 84 y.o.   MRN: [id number]  HPI    Review of Systems  All other systems reviewed and are negative.      Objective:   Physical Exam        Assessment & Plan:

## 2018-04-06 DIAGNOSIS — Z23 Encounter for immunization: Secondary | ICD-10-CM | POA: Diagnosis not present

## 2018-04-06 DIAGNOSIS — F0390 Unspecified dementia without behavioral disturbance: Secondary | ICD-10-CM | POA: Diagnosis not present

## 2018-04-06 DIAGNOSIS — I10 Essential (primary) hypertension: Secondary | ICD-10-CM | POA: Diagnosis not present

## 2018-04-06 DIAGNOSIS — Z681 Body mass index (BMI) 19 or less, adult: Secondary | ICD-10-CM | POA: Diagnosis not present

## 2018-04-06 DIAGNOSIS — I359 Nonrheumatic aortic valve disorder, unspecified: Secondary | ICD-10-CM | POA: Diagnosis not present

## 2018-04-06 DIAGNOSIS — E559 Vitamin D deficiency, unspecified: Secondary | ICD-10-CM | POA: Diagnosis not present

## 2018-04-06 DIAGNOSIS — E538 Deficiency of other specified B group vitamins: Secondary | ICD-10-CM | POA: Diagnosis not present

## 2018-04-06 DIAGNOSIS — N401 Enlarged prostate with lower urinary tract symptoms: Secondary | ICD-10-CM | POA: Diagnosis not present

## 2018-04-06 DIAGNOSIS — J449 Chronic obstructive pulmonary disease, unspecified: Secondary | ICD-10-CM | POA: Diagnosis not present

## 2018-10-02 DIAGNOSIS — R0902 Hypoxemia: Secondary | ICD-10-CM | POA: Diagnosis not present

## 2018-10-02 DIAGNOSIS — R509 Fever, unspecified: Secondary | ICD-10-CM | POA: Diagnosis not present

## 2018-10-02 DIAGNOSIS — G9341 Metabolic encephalopathy: Secondary | ICD-10-CM | POA: Diagnosis not present

## 2018-10-02 DIAGNOSIS — M9902 Segmental and somatic dysfunction of thoracic region: Secondary | ICD-10-CM | POA: Diagnosis not present

## 2018-10-02 DIAGNOSIS — I2510 Atherosclerotic heart disease of native coronary artery without angina pectoris: Secondary | ICD-10-CM | POA: Diagnosis not present

## 2018-10-02 DIAGNOSIS — M25532 Pain in left wrist: Secondary | ICD-10-CM | POA: Diagnosis not present

## 2018-10-02 DIAGNOSIS — N400 Enlarged prostate without lower urinary tract symptoms: Secondary | ICD-10-CM | POA: Diagnosis not present

## 2018-10-02 DIAGNOSIS — I5032 Chronic diastolic (congestive) heart failure: Secondary | ICD-10-CM | POA: Diagnosis not present

## 2018-10-02 DIAGNOSIS — R404 Transient alteration of awareness: Secondary | ICD-10-CM | POA: Diagnosis not present

## 2018-10-02 DIAGNOSIS — I6602 Occlusion and stenosis of left middle cerebral artery: Secondary | ICD-10-CM | POA: Diagnosis not present

## 2018-10-02 DIAGNOSIS — Z79899 Other long term (current) drug therapy: Secondary | ICD-10-CM | POA: Diagnosis not present

## 2018-10-02 DIAGNOSIS — W19XXXA Unspecified fall, initial encounter: Secondary | ICD-10-CM | POA: Diagnosis not present

## 2018-10-02 DIAGNOSIS — Z87891 Personal history of nicotine dependence: Secondary | ICD-10-CM | POA: Diagnosis not present

## 2018-10-02 DIAGNOSIS — I639 Cerebral infarction, unspecified: Secondary | ICD-10-CM | POA: Diagnosis not present

## 2018-10-02 DIAGNOSIS — Z66 Do not resuscitate: Secondary | ICD-10-CM | POA: Diagnosis not present

## 2018-10-02 DIAGNOSIS — N401 Enlarged prostate with lower urinary tract symptoms: Secondary | ICD-10-CM | POA: Diagnosis not present

## 2018-10-02 DIAGNOSIS — M9903 Segmental and somatic dysfunction of lumbar region: Secondary | ICD-10-CM | POA: Diagnosis not present

## 2018-10-02 DIAGNOSIS — E860 Dehydration: Secondary | ICD-10-CM | POA: Diagnosis not present

## 2018-10-02 DIAGNOSIS — I252 Old myocardial infarction: Secondary | ICD-10-CM | POA: Diagnosis not present

## 2018-10-02 DIAGNOSIS — R410 Disorientation, unspecified: Secondary | ICD-10-CM | POA: Diagnosis not present

## 2018-10-02 DIAGNOSIS — K219 Gastro-esophageal reflux disease without esophagitis: Secondary | ICD-10-CM | POA: Diagnosis not present

## 2018-10-02 DIAGNOSIS — N289 Disorder of kidney and ureter, unspecified: Secondary | ICD-10-CM | POA: Diagnosis not present

## 2018-10-02 DIAGNOSIS — M9905 Segmental and somatic dysfunction of pelvic region: Secondary | ICD-10-CM | POA: Diagnosis not present

## 2018-10-02 DIAGNOSIS — R7989 Other specified abnormal findings of blood chemistry: Secondary | ICD-10-CM | POA: Diagnosis not present

## 2018-10-02 DIAGNOSIS — R4020 Unspecified coma: Secondary | ICD-10-CM | POA: Diagnosis not present

## 2018-10-02 DIAGNOSIS — Z7401 Bed confinement status: Secondary | ICD-10-CM | POA: Diagnosis not present

## 2018-10-02 DIAGNOSIS — M2550 Pain in unspecified joint: Secondary | ICD-10-CM | POA: Diagnosis not present

## 2018-10-02 DIAGNOSIS — R29712 NIHSS score 12: Secondary | ICD-10-CM | POA: Diagnosis not present

## 2018-10-02 DIAGNOSIS — Z742 Need for assistance at home and no other household member able to render care: Secondary | ICD-10-CM | POA: Diagnosis not present

## 2018-10-02 DIAGNOSIS — R4182 Altered mental status, unspecified: Secondary | ICD-10-CM | POA: Diagnosis not present

## 2018-10-02 DIAGNOSIS — R338 Other retention of urine: Secondary | ICD-10-CM | POA: Diagnosis not present

## 2018-10-02 DIAGNOSIS — J449 Chronic obstructive pulmonary disease, unspecified: Secondary | ICD-10-CM | POA: Diagnosis not present

## 2018-10-02 DIAGNOSIS — R0689 Other abnormalities of breathing: Secondary | ICD-10-CM | POA: Diagnosis not present

## 2018-10-02 DIAGNOSIS — I110 Hypertensive heart disease with heart failure: Secondary | ICD-10-CM | POA: Diagnosis not present

## 2018-10-02 DIAGNOSIS — I350 Nonrheumatic aortic (valve) stenosis: Secondary | ICD-10-CM | POA: Diagnosis not present

## 2018-10-02 DIAGNOSIS — M6282 Rhabdomyolysis: Secondary | ICD-10-CM | POA: Diagnosis not present

## 2018-10-02 DIAGNOSIS — F0390 Unspecified dementia without behavioral disturbance: Secondary | ICD-10-CM | POA: Diagnosis not present

## 2018-10-02 DIAGNOSIS — M791 Myalgia, unspecified site: Secondary | ICD-10-CM | POA: Diagnosis not present

## 2018-10-02 DIAGNOSIS — G459 Transient cerebral ischemic attack, unspecified: Secondary | ICD-10-CM | POA: Diagnosis not present

## 2018-10-02 DIAGNOSIS — I6521 Occlusion and stenosis of right carotid artery: Secondary | ICD-10-CM | POA: Diagnosis not present

## 2018-10-05 ENCOUNTER — Telehealth: Payer: Self-pay | Admitting: Vascular Surgery

## 2018-10-05 NOTE — Telephone Encounter (Signed)
-----   Message from Elam Dutch, MD sent at 10/05/2018  1:06 PM EDT ----- Charles George DOB 2033-06-04 Pt with recent stroke at [facility].  Has carotid stenosis but needs to recover some.    He needs appt with me in 4-6 weeks with carotid duplex.  Contact is his son [place]

## 2018-10-05 NOTE — Telephone Encounter (Signed)
sch appt spk to pt nephew mld ltr  11/12/2018 10am Carotid [id number] f/u MD

## 2018-10-06 DIAGNOSIS — N400 Enlarged prostate without lower urinary tract symptoms: Secondary | ICD-10-CM

## 2018-10-06 DIAGNOSIS — J449 Chronic obstructive pulmonary disease, unspecified: Secondary | ICD-10-CM

## 2018-10-06 DIAGNOSIS — R7989 Other specified abnormal findings of blood chemistry: Secondary | ICD-10-CM

## 2018-10-07 DIAGNOSIS — R262 Difficulty in walking, not elsewhere classified: Secondary | ICD-10-CM | POA: Diagnosis not present

## 2018-10-07 DIAGNOSIS — R410 Disorientation, unspecified: Secondary | ICD-10-CM | POA: Diagnosis not present

## 2018-10-07 DIAGNOSIS — E785 Hyperlipidemia, unspecified: Secondary | ICD-10-CM | POA: Diagnosis not present

## 2018-10-07 DIAGNOSIS — R509 Fever, unspecified: Secondary | ICD-10-CM | POA: Diagnosis not present

## 2018-10-07 DIAGNOSIS — N400 Enlarged prostate without lower urinary tract symptoms: Secondary | ICD-10-CM | POA: Diagnosis not present

## 2018-10-07 DIAGNOSIS — M6282 Rhabdomyolysis: Secondary | ICD-10-CM | POA: Diagnosis not present

## 2018-10-07 DIAGNOSIS — L89150 Pressure ulcer of sacral region, unstageable: Secondary | ICD-10-CM | POA: Diagnosis not present

## 2018-10-07 DIAGNOSIS — J449 Chronic obstructive pulmonary disease, unspecified: Secondary | ICD-10-CM | POA: Diagnosis not present

## 2018-10-07 DIAGNOSIS — F0390 Unspecified dementia without behavioral disturbance: Secondary | ICD-10-CM | POA: Diagnosis not present

## 2018-10-07 DIAGNOSIS — G459 Transient cerebral ischemic attack, unspecified: Secondary | ICD-10-CM | POA: Diagnosis not present

## 2018-10-07 DIAGNOSIS — M2550 Pain in unspecified joint: Secondary | ICD-10-CM | POA: Diagnosis not present

## 2018-10-07 DIAGNOSIS — N390 Urinary tract infection, site not specified: Secondary | ICD-10-CM | POA: Diagnosis not present

## 2018-10-07 DIAGNOSIS — I69321 Dysphasia following cerebral infarction: Secondary | ICD-10-CM | POA: Diagnosis not present

## 2018-10-07 DIAGNOSIS — L89520 Pressure ulcer of left ankle, unstageable: Secondary | ICD-10-CM | POA: Diagnosis not present

## 2018-10-07 DIAGNOSIS — L89611 Pressure ulcer of right heel, stage 1: Secondary | ICD-10-CM | POA: Diagnosis not present

## 2018-10-07 DIAGNOSIS — I639 Cerebral infarction, unspecified: Secondary | ICD-10-CM | POA: Diagnosis not present

## 2018-10-07 DIAGNOSIS — L988 Other specified disorders of the skin and subcutaneous tissue: Secondary | ICD-10-CM | POA: Diagnosis not present

## 2018-10-07 DIAGNOSIS — R319 Hematuria, unspecified: Secondary | ICD-10-CM | POA: Diagnosis not present

## 2018-10-07 DIAGNOSIS — R05 Cough: Secondary | ICD-10-CM | POA: Diagnosis not present

## 2018-10-07 DIAGNOSIS — Z7401 Bed confinement status: Secondary | ICD-10-CM | POA: Diagnosis not present

## 2018-10-07 DIAGNOSIS — R7989 Other specified abnormal findings of blood chemistry: Secondary | ICD-10-CM | POA: Diagnosis not present

## 2018-10-12 DIAGNOSIS — E785 Hyperlipidemia, unspecified: Secondary | ICD-10-CM | POA: Diagnosis not present

## 2018-10-12 DIAGNOSIS — I639 Cerebral infarction, unspecified: Secondary | ICD-10-CM | POA: Diagnosis not present

## 2018-10-12 DIAGNOSIS — I69321 Dysphasia following cerebral infarction: Secondary | ICD-10-CM | POA: Diagnosis not present

## 2018-10-12 DIAGNOSIS — R262 Difficulty in walking, not elsewhere classified: Secondary | ICD-10-CM | POA: Diagnosis not present

## 2018-10-13 DIAGNOSIS — L89611 Pressure ulcer of right heel, stage 1: Secondary | ICD-10-CM | POA: Diagnosis not present

## 2018-10-13 DIAGNOSIS — L89150 Pressure ulcer of sacral region, unstageable: Secondary | ICD-10-CM | POA: Diagnosis not present

## 2018-10-20 DIAGNOSIS — L89150 Pressure ulcer of sacral region, unstageable: Secondary | ICD-10-CM | POA: Diagnosis not present

## 2018-10-20 DIAGNOSIS — L89611 Pressure ulcer of right heel, stage 1: Secondary | ICD-10-CM | POA: Diagnosis not present

## 2018-10-20 DIAGNOSIS — L89520 Pressure ulcer of left ankle, unstageable: Secondary | ICD-10-CM | POA: Diagnosis not present

## 2018-10-20 DIAGNOSIS — L988 Other specified disorders of the skin and subcutaneous tissue: Secondary | ICD-10-CM | POA: Diagnosis not present

## 2018-10-21 ENCOUNTER — Other Ambulatory Visit: Payer: Self-pay

## 2018-10-21 DIAGNOSIS — I6529 Occlusion and stenosis of unspecified carotid artery: Secondary | ICD-10-CM

## 2018-10-23 DEATH — deceased

## 2018-11-12 ENCOUNTER — Encounter (HOSPITAL_COMMUNITY): Payer: PPO

## 2018-11-12 ENCOUNTER — Ambulatory Visit: Payer: PPO | Admitting: Vascular Surgery
